# Patient Record
Sex: Male | Born: 1950 | Race: White | Hispanic: No | Smoking: Never smoker
Health system: Southern US, Community
[De-identification: ages and names within clinical notes are randomized; demographics above are authoritative.]

## PROBLEM LIST (undated history)

## (undated) DIAGNOSIS — C61 Malignant neoplasm of prostate: Secondary | ICD-10-CM

## (undated) DIAGNOSIS — I10 Essential (primary) hypertension: Secondary | ICD-10-CM

## (undated) DIAGNOSIS — C801 Malignant (primary) neoplasm, unspecified: Secondary | ICD-10-CM

## (undated) DIAGNOSIS — M109 Gout, unspecified: Secondary | ICD-10-CM

## (undated) HISTORY — DX: Essential (primary) hypertension: I10

## (undated) HISTORY — DX: Gout, unspecified: M10.9

---

## 1975-07-26 HISTORY — PX: RETINAL DETACHMENT SURGERY: SHX105

## 2003-07-26 HISTORY — PX: FINGER SURGERY: SHX640

## 2012-02-15 ENCOUNTER — Encounter: Payer: Self-pay | Admitting: Internal Medicine

## 2012-03-15 ENCOUNTER — Ambulatory Visit (AMBULATORY_SURGERY_CENTER): Payer: 59 | Admitting: *Deleted

## 2012-03-15 VITALS — Ht 73.0 in | Wt 171.7 lb

## 2012-03-15 DIAGNOSIS — Z1211 Encounter for screening for malignant neoplasm of colon: Secondary | ICD-10-CM

## 2012-03-15 MED ORDER — NA SULFATE-K SULFATE-MG SULF 17.5-3.13-1.6 GM/177ML PO SOLN: ORAL | Status: DC

## 2012-03-29 ENCOUNTER — Ambulatory Visit (AMBULATORY_SURGERY_CENTER): Payer: 59 | Admitting: Internal Medicine

## 2012-03-29 ENCOUNTER — Encounter: Payer: Self-pay | Admitting: Internal Medicine

## 2012-03-29 VITALS — BP 118/86 | HR 67 | Temp 97.0°F | Resp 18 | Ht 73.0 in | Wt 171.0 lb

## 2012-03-29 DIAGNOSIS — Z1211 Encounter for screening for malignant neoplasm of colon: Secondary | ICD-10-CM

## 2012-03-29 MED ORDER — SODIUM CHLORIDE 0.9 % IV SOLN: 500.0000 mL | INTRAVENOUS | Status: DC

## 2012-03-29 NOTE — Op Note (Signed)
[facility] [address] [place], [address]   COLONOSCOPY PROCEDURE REPORT  PATIENT: Nathan Schmitt, Nathan Schmitt  MR#: [id number] BIRTHDATE: January 10, 1951 , 61  yrs. old GENDER: Male ENDOSCOPIST: Beverley Fiedler, MD REFERRED BY:Orr, Richard PROCEDURE DATE:  03/29/2012 PROCEDURE:   Colonoscopy, screening ASA CLASS:   Class II INDICATIONS:average risk screening and first colonoscopy. MEDICATIONS: MAC sedation, administered by CRNA and Propofol (Diprivan) 230 mg IV  DESCRIPTION OF PROCEDURE:   After the risks benefits and alternatives of the procedure were thoroughly explained, informed consent was obtained.  A digital rectal exam revealed no rectal mass.   The LB PCF-H180AL [id number]  endoscope was introduced through the anus and advanced to the cecum, which was identified by both the appendix and ileocecal valve. No adverse events experienced. The quality of the prep was Suprep good  The instrument was then slowly withdrawn as the colon was fully examined.   COLON FINDINGS: Moderate diverticulosis was noted at the cecum, in the ascending colon, descending colon, and sigmoid colon.   The colon was otherwise normal.  There was no diverticulosis, inflamation, polyps or cancers unless previously stated. Retroflexed views revealed no abnormalities. The time to cecum=3 minutes 07 seconds.  Withdrawal time=11 minutes 30 seconds.  The scope was withdrawn and the procedure completed.  COMPLICATIONS: There were no complications.  ENDOSCOPIC IMPRESSION: 1.   Moderate diverticulosis was noted at the cecum, in the ascending colon, descending colon, and sigmoid colon 2.   The colon was otherwise normal  RECOMMENDATIONS: 1.  High fiber diet 2.  You should continue to follow colorectal cancer screening guidelines for "routine risk" patients with a repeat colonoscopy in 10 years.  There is no need for FOBT (stool) testing for at least 5 years.   eSigned:  Beverley Fiedler, MD 03/29/2012  9:58 AM   cc: Royanne Foots MD and The Patient

## 2012-03-29 NOTE — Patient Instructions (Addendum)

## 2012-03-29 NOTE — Progress Notes (Signed)
Patient did not experience any of the following events: a burn prior to discharge; a fall within the facility; wrong site/side/patient/procedure/implant event; or a hospital transfer or hospital admission upon discharge from the facility. (G8907) Patient did not have preoperative order for IV antibiotic SSI prophylaxis. (G8918)  

## 2012-03-30 ENCOUNTER — Telehealth: Payer: Self-pay | Admitting: *Deleted

## 2012-03-30 NOTE — Telephone Encounter (Signed)
  Follow up Call-  Call back number 03/29/2012  Post procedure Call Back phone  # [phone]  Permission to leave phone message Yes     Patient questions:  Do you have a fever, pain , or abdominal swelling? no Pain Score  0 *  Have you tolerated food without any problems? yes  Have you been able to return to your normal activities? yes  Do you have any questions about your discharge instructions: Diet   no Medications  no Follow up visit  no  Do you have questions or concerns about your Care? no  Actions: * If pain score is 4 or above: No action needed, pain <4.

## 2013-01-01 NOTE — Progress Notes (Signed)
GU Location of Tumor / Histology: Prostate dx 12/16/11 If Prostate Cancer, Gleason Score is (3+3=6,& 3+4=7) and PSA is (5.83) 04/17/12 bx=gleason 3+3=6 PSA 02/10/12=5.98   Patient presented with signs/symptoms of:No signs or symptoms found during routine exam of elevated PSA. Biopsies of Prostate on 12/14/12 &  04/17/12 (if applicable) revealed:Adenocarcinoma Past/Anticipated interventions by urology, if ZOX:WRUEAVW to decide if he prefers surgery or radiation as either one would be beneficial.  Past/Anticipated interventions by medical oncology, if UJW:JXBJ Weight changes, if any:No  Bowel/Bladder complaints, if any: Nocturia x2, Nausea/Vomiting, if any: No Pain issues, if any:No   SAFETY ISSUES:  Prior radiation? No                                                                                                                                                                                                                                                                                                                                             Pacemaker/ICD? NO  Possible current pregnancy? No  Is the patient on methotrexate? NO  Current Complaints / other details: Married,  1 son, 1 daughter,2 step-daughters, family hx breast cancer Father deceased age 29,  Allergies: PCNS

## 2013-01-02 ENCOUNTER — Ambulatory Visit
Admission: RE | Admit: 2013-01-02 | Discharge: 2013-01-02 | Disposition: A | Discharge status: Home / Self Care | Payer: 59 | Source: Ambulatory Visit | Attending: Radiation Oncology | Admitting: Radiation Oncology

## 2013-01-02 ENCOUNTER — Encounter: Payer: Self-pay | Admitting: Radiation Oncology

## 2013-01-02 VITALS — BP 119/78 | HR 66 | Temp 98.0°F | Ht 73.0 in | Wt 174.3 lb

## 2013-01-02 DIAGNOSIS — I10 Essential (primary) hypertension: Secondary | ICD-10-CM | POA: Insufficient documentation

## 2013-01-02 DIAGNOSIS — C61 Malignant neoplasm of prostate: Secondary | ICD-10-CM | POA: Insufficient documentation

## 2013-01-02 DIAGNOSIS — M109 Gout, unspecified: Secondary | ICD-10-CM | POA: Insufficient documentation

## 2013-01-02 DIAGNOSIS — Z79899 Other long term (current) drug therapy: Secondary | ICD-10-CM | POA: Insufficient documentation

## 2013-01-02 NOTE — Progress Notes (Signed)
Please see the Nurse Progress Note in the MD Initial Consult Encounter for this patient. 

## 2013-01-02 NOTE — Progress Notes (Signed)
[facility] Radiation Oncology NEW PATIENT EVALUATION  Name: Nathan Schmitt MRN: [id number]  Date:   01/02/2013           DOB: 10/24/1950  Status: outpatient   CC: No primary provider on file.  Kathi Ludwig, *  Dr. Crecencio Mc   REFERRING PHYSICIAN: Jethro Bolus I, *   DIAGNOSIS: Stage TI C. intermediate risk adenocarcinoma prostate   HISTORY OF PRESENT ILLNESS:  Nathan Schmitt is a 62 y.o. male who is seen today for the courtesy Dr. Patsi Sears for discussion of possible radiation therapy in the management of his stage TI C. intermediate risk adenocarcinoma prostate. He had fluctuating PSAs in the 4 range up until a rise to 5.9 and 2013 when he was seen at [facility]. He was seen by Dr. Patsi Sears who performed ultrasound-guided biopsies on 04/17/2012 and he was found to have Gleason 6 (3+3) involving less than 5% of one core from the left apex. His PSA rose to 5.83 by 10/24/2012 and repeat biopsies on 12/14/2012 showed Gleason 7 (3+4) involving 20% of one core from the right apex,  Gleason 6 (3+3) involving 20% of each of 2 cores from the right lateral apex and 5% of each of 2 cores from the left lateral apex and 5% of one core from the left lateral mid gland. He is doing well from a GU and GI standpoint. His gland volume was 30.3 cc. He does have mild erectile dysfunction. He was seen in consultation by Dr. Laverle Patter for discussion of robotic prostatectomy. PREVIOUS RADIATION THERAPY: No   PAST MEDICAL HISTORY:  has a past medical history of Hypertension and Gout.     PAST SURGICAL HISTORY:  Past Surgical History  Procedure Laterality Date  . Retinal detachment surgery  1977    left  . Finger surgery  2005    removal cyst 5th finger left hand     FAMILY HISTORY: family history includes Colon cancer (age of onset: 57) in his maternal uncle and Prostate cancer (age of onset: 45) in his father.  There is no history of Stomach cancer and Rectal  cancer. His father died from metastatic prostate cancer a 70. He was diagnosed at age 33. His mother is alive, having had an aortic valve replacement. She is 84.   SOCIAL HISTORY:  reports that he has never smoked. He quit smokeless tobacco use about 6 years ago. His smokeless tobacco use included Chew. He reports that  drinks alcohol. He reports that he does not use illicit drugs. Married, 2 children. Retired from a Education officer, community, currently works part-time in [employer].   ALLERGIES: Penicillins   MEDICATIONS:  Current Outpatient Prescriptions  Medication Sig Dispense Refill  . allopurinol (ZYLOPRIM) 300 MG tablet Take 300 mg by mouth daily.      Marland Kitchen lisinopril (PRINIVIL,ZESTRIL) 10 MG tablet Take 10 mg by mouth daily.       No current facility-administered medications for this encounter.     REVIEW OF SYSTEMS:  Pertinent items are noted in HPI.    PHYSICAL EXAM:  height is 6\' 1"  (1.854 m) and weight is 174 lb 4.8 oz (79.062 kg). His temperature is 98 F (36.7 C). His blood pressure is 119/78 and his pulse is 66.   Alert, oriented, in well-developed 62 year old white male appearing slightly younger than his stated age. Head and neck examination: Grossly unremarkable. Nodes: Without palpable cervical or supraclavicular adenopathy. Chest: Lungs clear. Back: Without spinal or CVA tenderness. Heart:  Regular rate and rhythm. Abdomen: Without masses organomegaly. Genitalia: Unremarkable to inspection. Rectal: The prostate gland is normal size and is without focal induration or nodularity. Extremities: Without edema.   LABORATORY DATA:  No results found for this basename: WBC, HGB, HCT, MCV, PLT   No results found for this basename: NA, K, CL, CO2   No results found for this basename: ALT, AST, GGT, ALKPHOS, BILITOT   PSA 5.83 from 10/24/2012   IMPRESSION: Stage TI C. intermediate risk adenocarcinoma prostate. I explained to the patient that his prognosis is related to his  stage, PSA level, and Gleason score. His stage and PSA level are favorable while his Gleason score of 7 is of intermediate favorability. Other prognostic factors include PSA doubling time and disease volume which also appear to be favorable. His radiation therapy options include seed implantation alone or 8 weeks of external beam/IMRT. We discussed the potential acute and late toxicities of radiation therapy. We also talked about the need for a CT arch study should he want to consider seed implantation. Success with surgery, or radiation therapy is felt to be equivalent. His prognosis is favorable. He'll think things over and get back in touch with Dr. Patsi Sears. He was given literature for review.   PLAN: As discussed above  I spent 60 minutes minutes face to face with the patient and more than 50% of that time was spent in counseling and/or coordination of care.

## 2013-01-03 NOTE — Addendum Note (Signed)
Encounter addended by: Tessa Lerner, RN on: 01/03/2013  6:00 PM<BR>     Documentation filed: Charges VN

## 2013-02-01 ENCOUNTER — Other Ambulatory Visit: Payer: Self-pay | Admitting: Urology

## 2013-02-06 ENCOUNTER — Encounter (HOSPITAL_COMMUNITY): Payer: Self-pay | Admitting: Pharmacy Technician

## 2013-02-07 ENCOUNTER — Other Ambulatory Visit (HOSPITAL_COMMUNITY): Payer: Self-pay | Admitting: Urology

## 2013-02-07 NOTE — Patient Instructions (Addendum)
20 EDYN QAZI  02/07/2013   Your procedure is scheduled on:  02/14/13  THURSDAY  Report to [facility] at   0515    AM.  Call this number if you have problems the morning of surgery: [phone]       Remember: BOWEL PREP AS PER OFFICE  Do not TAKE ANYTHING BY MOUTH  After Midnight. Wednesday NIGHT   Take these medicines the morning of surgery with A SIP OF WATER:  NONE   .  Contacts, dentures or partial plates can not be worn to surgery  Leave suitcase in the car. After surgery it may be brought to your room.  For patients admitted to the hospital, checkout time is 11:00 AM day of  discharge.             SPECIAL INSTRUCTIONS- SEE [place] PREPARING FOR SURGERY INSTRUCTION SHEET-     DO NOT WEAR JEWELRY, LOTIONS, POWDERS, OR PERFUMES.  WOMEN-- DO NOT SHAVE LEGS OR UNDERARMS FOR 12 HOURS BEFORE SHOWERS. MEN MAY SHAVE FACE.  Patients discharged the day of surgery will not be allowed to drive home. IF going home the day of surgery, you must have a driver and someone to stay with you for the first 24 hours  Name and phone number of your driver:   OVERNIGHT STAY     WIFE                                                                 Please read over the following fact sheets that you were given:  Incentive Spirometry Sheet, Blood Transfusion Sheet  Information                                                                                   Oral [place]gren  PST 336  [phone]                 FAILURE TO FOLLOW THESE INSTRUCTIONS MAY RESULT IN  CANCELLATION   OF YOUR SURGERY                                                  Patient Signature _____________________________

## 2013-02-08 ENCOUNTER — Ambulatory Visit (HOSPITAL_COMMUNITY)
Admission: RE | Admit: 2013-02-08 | Discharge: 2013-02-08 | Disposition: A | Discharge status: Home / Self Care | Payer: 59 | Source: Ambulatory Visit | Attending: Urology | Admitting: Urology

## 2013-02-08 ENCOUNTER — Encounter (HOSPITAL_COMMUNITY): Payer: Self-pay

## 2013-02-08 ENCOUNTER — Encounter (HOSPITAL_COMMUNITY)
Admission: RE | Admit: 2013-02-08 | Discharge: 2013-02-08 | Disposition: A | Discharge status: Home / Self Care | Payer: 59 | Source: Ambulatory Visit | Attending: Urology | Admitting: Urology

## 2013-02-08 DIAGNOSIS — Z01810 Encounter for preprocedural cardiovascular examination: Secondary | ICD-10-CM | POA: Insufficient documentation

## 2013-02-08 DIAGNOSIS — C61 Malignant neoplasm of prostate: Secondary | ICD-10-CM | POA: Insufficient documentation

## 2013-02-08 DIAGNOSIS — Z01812 Encounter for preprocedural laboratory examination: Secondary | ICD-10-CM | POA: Insufficient documentation

## 2013-02-08 DIAGNOSIS — Z01818 Encounter for other preprocedural examination: Secondary | ICD-10-CM | POA: Insufficient documentation

## 2013-02-08 HISTORY — DX: Malignant (primary) neoplasm, unspecified: C80.1

## 2013-02-08 LAB — BASIC METABOLIC PANEL
BUN: 17 mg/dL (ref 6–23)
GFR calc Af Amer: 69 mL/min — ABNORMAL LOW (ref >90)
GFR calc non Af Amer: 60 mL/min — ABNORMAL LOW (ref >90)
Potassium: 4.3 mEq/L (ref 3.5–5.1)
Sodium: 139 mEq/L (ref 135–145)

## 2013-02-08 LAB — CBC
Hemoglobin: 12.3 g/dL — ABNORMAL LOW (ref 13.0–17.0)
MCHC: 32 g/dL (ref 30.0–36.0)

## 2013-02-13 NOTE — H&P (Signed)
Chief Complaint  Prostate Cancer     History of Present Illness  [name] is a 6 healthy man who was noted to have an elevated PSA of 5.98 prompting an intial urologic consultation by Dr. Patsi Sears last year.  His PSA was repeated and remained elevated at 4.91 which resulted in a prostate biopsy on 04/17/12.  This biopsy demonstrated < 5 % of 1 out of 12 biopsy cores positive for Gleason 3+3=6 adenocarcinoma. After discussing options, he initially elected to proceed with active surveillance. His PSA increased to 5.83 in April 2014 and he underwent a repeat biopsy on 12/14/12 which demonstrated 6 out of 20 cores to be positive with one core now indicating Gleason 3+4=7 adenocarcinoma. He does have a paternal family history of prostate cancer.  His father apparently underwent radiation therapy but was quickly determined to have metastatic disease and died of metastatic prostate cancer at age 55.  TNM stage: cT1c Nx Mx PSA: 5.83 Gleason score: 3+4=7 Initial diagnosis: September 2013 Biopsy (12/14/12): 6/20 cores positive    Left: L lateral apex (2/2 cores, 5% - 3+3=6, 5% - 3+3=6), L lateral mid (5%, 3+3=6)    Right: R apex (20%, 3+4=7), R lateral apex (2/2 cores, 20% - 3+3=6, 20%, 3+3=6) Prostate volume: 30.3 cc  Nomogram OC disease: 76% EPE: 16% SVI: 3% LNI: 2.4% PFS (surgery): 95% at 5 years, 92% at 10 years DSS: 99% at 5 and 10 years  Urinary function: He has minimal lower urinary tract symptoms.  IPSS is 2. Erectile function: He very mild erectile dysfunction.  He has not required medical therapy.  SHIM score is 22.   Past Medical History Problems  1. History of  Gout 274.9 2. History of  Hypertension 401.9  Surgical History Problems  1. History of  Hand Surgery Left 2. History of  Repair Of Retinal Detachment  Current Meds 1. Allopurinol 300 MG Oral Tablet; Therapy: (Recorded:27Aug2013) to 2. Lisinopril 10 MG Oral Tablet; Therapy: (Recorded:27Aug2013)  to  Allergies Medication  1. Penicillins   Penicillin causes hives.   Family History Problems  1. Maternal history of  Breast Cancer V16.3 2. Sororal history of  Breast Cancer V16.3 3. Family history of  Father Deceased At Age 12 prostate cancer 4. Paternal history of  Prostate Cancer V16.42  Social History Problems    Alcohol Use occasionally   Marital History - Currently Married   Never A Smoker   Occupation: Retired   Tobacco Use 305.1  Review of Systems Constitutional, skin, eye, otolaryngeal, hematologic/lymphatic, cardiovascular, pulmonary, endocrine, musculoskeletal, gastrointestinal, neurological and psychiatric system(s) were reviewed and pertinent findings if present are noted.    Vitals  BMI Calculated: 23.19 BSA Calculated: 2.03 Height: 6 ft 1 in Weight: 175 lb    Physical Exam Constitutional: Well nourished and well developed . No acute distress.  ENT:. The ears and nose are normal in appearance.  Neck: The appearance of the neck is normal and no neck mass is present.  Pulmonary: No respiratory distress, normal respiratory rhythm and effort and clear bilateral breath sounds.  Cardiovascular: Heart rate and rhythm are normal . No peripheral edema.  Abdomen: The abdomen is flat. The abdomen is soft and nontender. No masses are palpated. No CVA tenderness. No hernias are palpable. No hepatosplenomegaly noted.       Assessment  Prostate Cancer 185        Discussion/Summary  1.  Clinically localized prostate cancer: He has elected to proceed with surgical therapy and will  undergo a bilateral nerve sparing robotic-assisted laparoscopic radical prostatectomy and pelvic lymphadenectomy.

## 2013-02-14 ENCOUNTER — Ambulatory Visit (HOSPITAL_COMMUNITY): Payer: 59 | Admitting: Anesthesiology

## 2013-02-14 ENCOUNTER — Encounter (HOSPITAL_COMMUNITY): Payer: Self-pay | Admitting: *Deleted

## 2013-02-14 ENCOUNTER — Observation Stay (HOSPITAL_COMMUNITY)
Admission: RE | Admit: 2013-02-14 | Discharge: 2013-02-15 | Disposition: A | Discharge status: Home / Self Care | Payer: 59 | Source: Ambulatory Visit | Attending: Urology | Admitting: Urology

## 2013-02-14 ENCOUNTER — Encounter (HOSPITAL_COMMUNITY)
Admission: RE | Disposition: A | Discharge status: Home / Self Care | Payer: Self-pay | Source: Ambulatory Visit | Attending: Urology

## 2013-02-14 ENCOUNTER — Encounter (HOSPITAL_COMMUNITY): Payer: Self-pay | Admitting: Anesthesiology

## 2013-02-14 DIAGNOSIS — Z803 Family history of malignant neoplasm of breast: Secondary | ICD-10-CM | POA: Insufficient documentation

## 2013-02-14 DIAGNOSIS — R110 Nausea: Secondary | ICD-10-CM | POA: Insufficient documentation

## 2013-02-14 DIAGNOSIS — C61 Malignant neoplasm of prostate: Principal | ICD-10-CM | POA: Insufficient documentation

## 2013-02-14 DIAGNOSIS — Z8042 Family history of malignant neoplasm of prostate: Secondary | ICD-10-CM | POA: Insufficient documentation

## 2013-02-14 DIAGNOSIS — Z79899 Other long term (current) drug therapy: Secondary | ICD-10-CM | POA: Insufficient documentation

## 2013-02-14 DIAGNOSIS — I10 Essential (primary) hypertension: Secondary | ICD-10-CM | POA: Insufficient documentation

## 2013-02-14 HISTORY — PX: ROBOT ASSISTED LAPAROSCOPIC RADICAL PROSTATECTOMY: SHX5141

## 2013-02-14 LAB — TYPE AND SCREEN
ABO/RH(D): A NEG
Antibody Screen: NEGATIVE

## 2013-02-14 LAB — HEMOGLOBIN AND HEMATOCRIT, BLOOD
HCT: 38.3 % — ABNORMAL LOW (ref 39.0–52.0)
Hemoglobin: 12.1 g/dL — ABNORMAL LOW (ref 13.0–17.0)

## 2013-02-14 SURGERY — ROBOTIC ASSISTED LAPAROSCOPIC RADICAL PROSTATECTOMY LEVEL 1
Anesthesia: General | Wound class: Clean Contaminated

## 2013-02-14 MED ORDER — LIDOCAINE HCL (CARDIAC) 20 MG/ML IV SOLN
INTRAVENOUS | Status: DC | PRN
  Administered 2013-02-14: 30 mg via INTRAVENOUS

## 2013-02-14 MED ORDER — ACETAMINOPHEN 325 MG PO TABS: 650.0000 mg | ORAL_TABLET | ORAL | Status: DC | PRN

## 2013-02-14 MED ORDER — SODIUM CHLORIDE 0.9 % IR SOLN
Status: DC | PRN
  Administered 2013-02-14: 300 mL via INTRAVESICAL

## 2013-02-14 MED ORDER — LACTATED RINGERS IV SOLN
INTRAVENOUS | Status: DC | PRN
  Administered 2013-02-14: 08:00:00

## 2013-02-14 MED ORDER — KETOROLAC TROMETHAMINE 15 MG/ML IJ SOLN
15.0000 mg | Freq: Four times a day (QID) | INTRAMUSCULAR | Status: DC
  Administered 2013-02-14 – 2013-02-15 (×4): 15 mg via INTRAVENOUS
  Filled 2013-02-14 (×5): qty 1

## 2013-02-14 MED ORDER — LACTATED RINGERS IV SOLN
INTRAVENOUS | Status: DC | PRN
  Administered 2013-02-14 (×2): via INTRAVENOUS

## 2013-02-14 MED ORDER — CEFAZOLIN SODIUM 1-5 GM-% IV SOLN
1.0000 g | Freq: Three times a day (TID) | INTRAVENOUS | Status: AC
  Administered 2013-02-14 (×2): 1 g via INTRAVENOUS
  Filled 2013-02-14 (×2): qty 50

## 2013-02-14 MED ORDER — EPHEDRINE SULFATE 50 MG/ML IJ SOLN
INTRAMUSCULAR | Status: DC | PRN
  Administered 2013-02-14 (×3): 10 mg via INTRAVENOUS

## 2013-02-14 MED ORDER — DIPHENHYDRAMINE HCL 12.5 MG/5ML PO ELIX: 12.5000 mg | ORAL_SOLUTION | Freq: Four times a day (QID) | ORAL | Status: DC | PRN

## 2013-02-14 MED ORDER — SUCCINYLCHOLINE CHLORIDE 20 MG/ML IJ SOLN
INTRAMUSCULAR | Status: DC | PRN
  Administered 2013-02-14: 100 mg via INTRAVENOUS

## 2013-02-14 MED ORDER — BUPIVACAINE-EPINEPHRINE 0.25% -1:200000 IJ SOLN
INTRAMUSCULAR | Status: DC | PRN
  Administered 2013-02-14: 30 mL

## 2013-02-14 MED ORDER — DOCUSATE SODIUM 100 MG PO CAPS
100.0000 mg | ORAL_CAPSULE | Freq: Two times a day (BID) | ORAL | Status: DC
  Administered 2013-02-14 – 2013-02-15 (×2): 100 mg via ORAL
  Filled 2013-02-14 (×3): qty 1

## 2013-02-14 MED ORDER — HYDROCODONE-ACETAMINOPHEN 5-325 MG PO TABS: 1.0000 | ORAL_TABLET | Freq: Four times a day (QID) | ORAL | Status: DC | PRN

## 2013-02-14 MED ORDER — CISATRACURIUM BESYLATE (PF) 10 MG/5ML IV SOLN
INTRAVENOUS | Status: DC | PRN
  Administered 2013-02-14: 1 mg via INTRAVENOUS
  Administered 2013-02-14: 10 mg via INTRAVENOUS
  Administered 2013-02-14: 6 mg via INTRAVENOUS

## 2013-02-14 MED ORDER — CIPROFLOXACIN HCL 500 MG PO TABS: 500.0000 mg | ORAL_TABLET | Freq: Two times a day (BID) | ORAL | Status: DC

## 2013-02-14 MED ORDER — FENTANYL CITRATE 0.05 MG/ML IJ SOLN
INTRAMUSCULAR | Status: DC | PRN
  Administered 2013-02-14: 100 ug via INTRAVENOUS
  Administered 2013-02-14: 25 ug via INTRAVENOUS
  Administered 2013-02-14: 100 ug via INTRAVENOUS
  Administered 2013-02-14: 25 ug via INTRAVENOUS

## 2013-02-14 MED ORDER — MORPHINE SULFATE 2 MG/ML IJ SOLN: 2.0000 mg | INTRAMUSCULAR | Status: DC | PRN

## 2013-02-14 MED ORDER — KCL IN DEXTROSE-NACL 20-5-0.45 MEQ/L-%-% IV SOLN
INTRAVENOUS | Status: DC
  Administered 2013-02-14 – 2013-02-15 (×3): via INTRAVENOUS
  Filled 2013-02-14 (×5): qty 1000

## 2013-02-14 MED ORDER — MIDAZOLAM HCL 5 MG/5ML IJ SOLN
INTRAMUSCULAR | Status: DC | PRN
  Administered 2013-02-14: 1 mg via INTRAVENOUS

## 2013-02-14 MED ORDER — SODIUM CHLORIDE 0.9 % IV BOLUS (SEPSIS)
1000.0000 mL | Freq: Once | INTRAVENOUS | Status: AC
  Administered 2013-02-14: 1000 mL via INTRAVENOUS

## 2013-02-14 MED ORDER — LACTATED RINGERS IV SOLN: INTRAVENOUS | Status: DC

## 2013-02-14 MED ORDER — HYDROMORPHONE HCL PF 1 MG/ML IJ SOLN
INTRAMUSCULAR | Status: DC | PRN
  Administered 2013-02-14 (×4): 0.5 mg via INTRAVENOUS

## 2013-02-14 MED ORDER — ONDANSETRON HCL 4 MG/2ML IJ SOLN
INTRAMUSCULAR | Status: DC | PRN
  Administered 2013-02-14 (×2): 2 mg via INTRAVENOUS

## 2013-02-14 MED ORDER — DIPHENHYDRAMINE HCL 50 MG/ML IJ SOLN: 12.5000 mg | Freq: Four times a day (QID) | INTRAMUSCULAR | Status: DC | PRN

## 2013-02-14 MED ORDER — PROPOFOL 10 MG/ML IV BOLUS
INTRAVENOUS | Status: DC | PRN
  Administered 2013-02-14: 200 mg via INTRAVENOUS

## 2013-02-14 MED ORDER — DEXAMETHASONE SODIUM PHOSPHATE 4 MG/ML IJ SOLN
INTRAMUSCULAR | Status: DC | PRN
  Administered 2013-02-14: 10 mg via INTRAVENOUS

## 2013-02-14 MED ORDER — CEFAZOLIN SODIUM-DEXTROSE 2-3 GM-% IV SOLR
2.0000 g | INTRAVENOUS | Status: AC
  Administered 2013-02-14: 2 g via INTRAVENOUS

## 2013-02-14 MED ORDER — KETAMINE HCL 10 MG/ML IJ SOLN
INTRAMUSCULAR | Status: DC | PRN
  Administered 2013-02-14: 20 mg via INTRAVENOUS

## 2013-02-14 MED ORDER — NEOSTIGMINE METHYLSULFATE 1 MG/ML IJ SOLN
INTRAMUSCULAR | Status: DC | PRN
  Administered 2013-02-14: 3 mg via INTRAVENOUS

## 2013-02-14 MED ORDER — ALLOPURINOL 300 MG PO TABS
300.0000 mg | ORAL_TABLET | Freq: Every day | ORAL | Status: DC
  Administered 2013-02-14: 300 mg via ORAL
  Filled 2013-02-14 (×2): qty 1

## 2013-02-14 MED ORDER — PROMETHAZINE HCL 25 MG/ML IJ SOLN: 6.2500 mg | INTRAMUSCULAR | Status: DC | PRN

## 2013-02-14 MED ORDER — INDIGOTINDISULFONATE SODIUM 8 MG/ML IJ SOLN
INTRAMUSCULAR | Status: DC | PRN
  Administered 2013-02-14 (×2): 5 mL via INTRAVENOUS

## 2013-02-14 MED ORDER — GLYCOPYRROLATE 0.2 MG/ML IJ SOLN
INTRAMUSCULAR | Status: DC | PRN
  Administered 2013-02-14: 0.3 mg via INTRAVENOUS
  Administered 2013-02-14: 0.1 mg via INTRAVENOUS
  Administered 2013-02-14: 0.4 mg via INTRAVENOUS

## 2013-02-14 MED ORDER — ONDANSETRON HCL 4 MG/2ML IJ SOLN
4.0000 mg | INTRAMUSCULAR | Status: DC | PRN
  Administered 2013-02-14: 4 mg via INTRAVENOUS
  Filled 2013-02-14: qty 2

## 2013-02-14 MED ORDER — HYDROMORPHONE HCL PF 1 MG/ML IJ SOLN
0.2500 mg | INTRAMUSCULAR | Status: DC | PRN
  Administered 2013-02-14 (×2): 0.5 mg via INTRAVENOUS

## 2013-02-14 SURGICAL SUPPLY — 42 items
CANISTER SUCTION 2500CC (MISCELLANEOUS) ×2 IMPLANT
CATH FOLEY 2WAY SLVR 18FR 30CC (CATHETERS) ×2 IMPLANT
CATH ROBINSON RED A/P 16FR (CATHETERS) ×2 IMPLANT
CATH ROBINSON RED A/P 8FR (CATHETERS) ×2 IMPLANT
CATH TIEMANN FOLEY 18FR 5CC (CATHETERS) ×2 IMPLANT
CHLORAPREP W/TINT 26ML (MISCELLANEOUS) ×2 IMPLANT
CLIP LIGATING HEM O LOK PURPLE (MISCELLANEOUS) ×4 IMPLANT
CLOTH BEACON ORANGE TIMEOUT ST (SAFETY) ×2 IMPLANT
CORD HIGH FREQUENCY UNIPOLAR (ELECTROSURGICAL) ×2 IMPLANT
COVER SURGICAL LIGHT HANDLE (MISCELLANEOUS) ×2 IMPLANT
COVER TIP SHEARS 8 DVNC (MISCELLANEOUS) ×1 IMPLANT
COVER TIP SHEARS 8MM DA VINCI (MISCELLANEOUS) ×1
CUTTER ECHEON FLEX ENDO 45 340 (ENDOMECHANICALS) ×2 IMPLANT
DECANTER SPIKE VIAL GLASS SM (MISCELLANEOUS) ×2 IMPLANT
DRAPE SURG IRRIG POUCH 19X23 (DRAPES) ×2 IMPLANT
DRSG TEGADERM 2-3/8X2-3/4 SM (GAUZE/BANDAGES/DRESSINGS) ×8 IMPLANT
DRSG TEGADERM 4X4.75 (GAUZE/BANDAGES/DRESSINGS) ×4 IMPLANT
DRSG TEGADERM 6X8 (GAUZE/BANDAGES/DRESSINGS) ×4 IMPLANT
ELECT REM PT RETURN 9FT ADLT (ELECTROSURGICAL) ×2
ELECTRODE REM PT RTRN 9FT ADLT (ELECTROSURGICAL) ×1 IMPLANT
GAUZE SPONGE 2X2 8PLY STRL LF (GAUZE/BANDAGES/DRESSINGS) ×1 IMPLANT
GLOVE BIO SURGEON STRL SZ 6.5 (GLOVE) ×2 IMPLANT
GLOVE BIOGEL M STRL SZ7.5 (GLOVE) ×4 IMPLANT
GOWN STRL NON-REIN LRG LVL3 (GOWN DISPOSABLE) ×6 IMPLANT
GOWN STRL REIN XL XLG (GOWN DISPOSABLE) ×4 IMPLANT
HOLDER FOLEY CATH W/STRAP (MISCELLANEOUS) ×2 IMPLANT
IV LACTATED RINGERS 1000ML (IV SOLUTION) ×2 IMPLANT
KIT ACCESSORY DA VINCI DISP (KITS) ×1
KIT ACCESSORY DVNC DISP (KITS) ×1 IMPLANT
NDL SAFETY ECLIPSE 18X1.5 (NEEDLE) ×1 IMPLANT
NEEDLE HYPO 18GX1.5 SHARP (NEEDLE) ×1
PACK ROBOT UROLOGY CUSTOM (CUSTOM PROCEDURE TRAY) ×2 IMPLANT
RELOAD GREEN ECHELON 45 (STAPLE) ×2 IMPLANT
SCISSORS LAP 5X35 DISP (ENDOMECHANICALS) ×2 IMPLANT
SET TUBE IRRIG SUCTION NO TIP (IRRIGATION / IRRIGATOR) ×2 IMPLANT
SOLUTION ELECTROLUBE (MISCELLANEOUS) ×2 IMPLANT
SPONGE GAUZE 2X2 STER 10/PKG (GAUZE/BANDAGES/DRESSINGS) ×1
SUT ETHILON 3 0 PS 1 (SUTURE) ×2 IMPLANT
SUT VICRYL 0 UR6 27IN ABS (SUTURE) ×4 IMPLANT
SYR 27GX1/2 1ML LL SAFETY (SYRINGE) ×2 IMPLANT
TOWEL OR NON WOVEN STRL DISP B (DISPOSABLE) ×2 IMPLANT
WATER STERILE IRR 1500ML POUR (IV SOLUTION) ×4 IMPLANT

## 2013-02-14 NOTE — Anesthesia Preprocedure Evaluation (Signed)
Anesthesia Evaluation  Patient identified by MRN, date of birth, ID band Patient awake    Reviewed: Allergy & Precautions, H&P , NPO status , Patient's Chart, lab work & pertinent test results  Airway Mallampati: II TM Distance: >3 FB Neck ROM: Full    Dental  (+) Teeth Intact and Dental Advisory Given,    Pulmonary neg pulmonary ROS,  breath sounds clear to auscultation  Pulmonary exam normal       Cardiovascular hypertension, Pt. on medications Rhythm:Regular Rate:Normal     Neuro/Psych negative neurological ROS  negative psych ROS   GI/Hepatic negative GI ROS, Neg liver ROS,   Endo/Other  negative endocrine ROS  Renal/GU negative Renal ROS  negative genitourinary   Musculoskeletal negative musculoskeletal ROS (+)   Abdominal   Peds  Hematology negative hematology ROS (+)   Anesthesia Other Findings   Reproductive/Obstetrics                           Anesthesia Physical Anesthesia Plan  ASA: II  Anesthesia Plan: General   Post-op Pain Management:    Induction: Intravenous  Airway Management Planned: Oral ETT  Additional Equipment:   Intra-op Plan:   Post-operative Plan: Extubation in OR  Informed Consent: I have reviewed the patients History and Physical, chart, labs and discussed the procedure including the risks, benefits and alternatives for the proposed anesthesia with the patient or authorized representative who has indicated his/her understanding and acceptance.   Dental advisory given  Plan Discussed with: CRNA  Anesthesia Plan Comments:         Anesthesia Quick Evaluation

## 2013-02-14 NOTE — Transfer of Care (Signed)
Immediate Anesthesia Transfer of Care Note  Patient: Nathan Schmitt  Procedure(s) Performed: Procedure(s): ROBOTIC ASSISTED LAPAROSCOPIC RADICAL PROSTATECTOMY  WITH BILATERAL NODE DISSECTIONS (N/A)  Patient Location: PACU  Anesthesia Type:General  Level of Consciousness: awake, alert , oriented, sedated and patient cooperative  Airway & Oxygen Therapy: Patient Spontanous Breathing and Patient connected to nasal cannula oxygen  Post-op Assessment: Report given to PACU RN and Post -op Vital signs reviewed and stable  Post vital signs: stable  Complications: No apparent anesthesia complications

## 2013-02-14 NOTE — Progress Notes (Signed)
Patient ID: Nathan Schmitt, male   DOB: [birth year], 62 y.o.   MRN: [id number] Post-op note  Subjective: The patient is doing well.  No complaints except some mild nausea.  Objective: Vital signs in last 24 hours: Temp:  [97.3 F (36.3 C)-97.6 F (36.4 C)] 97.6 F (36.4 C) (07/24 1115) Pulse Rate:  [48-64] 48 (07/24 1115) Resp:  [7-16] 12 (07/24 1115) BP: (109-129)/(55-72) 117/57 mmHg (07/24 1115) SpO2:  [97 %-100 %] 100 % (07/24 1115) Weight:  [78.019 kg (172 lb)] 78.019 kg (172 lb) (07/24 1115)  Intake/Output from previous day:   Intake/Output this shift: Total I/O In: 3000 [I.V.:2000; IV Piggyback:1000] Out: 80 [Urine:65; Blood:15]  Physical Exam:  General: Alert and oriented. Abdomen: Soft, Nondistended. Incisions: Clean and dry.  Lab Results:  Recent Labs  02/14/13 1035  HGB 12.1*  HCT 38.3*    Assessment/Plan: POD#0   1) Continue to monitor 2) Amb, IS, DVT prophy, pain control, clears     LOS: 0 days   YARBROUGH,Remmi Armenteros G. 02/14/2013, 2:17 PM

## 2013-02-14 NOTE — Op Note (Signed)

## 2013-02-14 NOTE — Interval H&P Note (Signed)
History and Physical Interval Note:  02/14/2013 7:37 AM  Nathan Schmitt  has presented today for surgery, with the diagnosis of PROSTATE CANCER  The various methods of treatment have been discussed with the patient and family. After consideration of risks, benefits and other options for treatment, the patient has consented to  Procedure(s): ROBOTIC ASSISTED LAPAROSCOPIC RADICAL PROSTATECTOMY LEVEL 1 (N/A) as a surgical intervention .  The patient's history has been reviewed, patient examined, no change in status, stable for surgery.  I have reviewed the patient's chart and labs.  Questions were answered to the patient's satisfaction.     Jilene Spohr,LES

## 2013-02-14 NOTE — Anesthesia Postprocedure Evaluation (Signed)
Anesthesia Post Note  Patient: Nathan Schmitt  Procedure(s) Performed: Procedure(s) (LRB): ROBOTIC ASSISTED LAPAROSCOPIC RADICAL PROSTATECTOMY  WITH BILATERAL NODE DISSECTIONS (N/A)  Anesthesia type: General  Patient location: PACU  Post pain: Pain level controlled  Post assessment: Post-op Vital signs reviewed  Last Vitals:  Filed Vitals:   02/14/13 1115  BP: 117/57  Pulse: 48  Temp: 36.4 C  Resp: 12    Post vital signs: Reviewed  Level of consciousness: sedated  Complications: No apparent anesthesia complications

## 2013-02-15 ENCOUNTER — Encounter (HOSPITAL_COMMUNITY): Payer: Self-pay | Admitting: Urology

## 2013-02-15 MED ORDER — BISACODYL 10 MG RE SUPP
10.0000 mg | Freq: Once | RECTAL | Status: AC
  Administered 2013-02-15: 10 mg via RECTAL
  Filled 2013-02-15: qty 1

## 2013-02-15 MED ORDER — HYDROCODONE-ACETAMINOPHEN 5-325 MG PO TABS: 1.0000 | ORAL_TABLET | Freq: Four times a day (QID) | ORAL | Status: DC | PRN

## 2013-02-15 NOTE — Discharge Summary (Signed)
  Date of admission: 02/14/2013  Date of discharge: 02/15/2013  Admission diagnosis: Prostate Cancer  Discharge diagnosis: Prostate Cancer  History and Physical: For full details, please see admission history and physical. Briefly, Nathan Schmitt is a 62 y.o. gentleman with localized prostate cancer.  After discussing management/treatment options, he elected to proceed with surgical treatment.  Hospital Course: SACHIT GILMAN was taken to the operating room on 02/14/2013 and underwent a robotic assisted laparoscopic radical prostatectomy. He tolerated this procedure well and without complications. Postoperatively, he was able to be transferred to a regular hospital room following recovery from anesthesia.  He was able to begin ambulating the night of surgery. He remained hemodynamically stable overnight.  He had excellent urine output with appropriately minimal output from his pelvic drain and his pelvic drain was removed on POD #1.  He was transitioned to oral pain medication, tolerated a clear liquid diet, and had met all discharge criteria and was able to be discharged home later on POD#1.  Laboratory values:  Recent Labs  02/14/13 1035 02/15/13 0440  HGB 12.1* 10.9*  HCT 38.3* 34.8*    Disposition: Home  Discharge instruction: He was instructed to be ambulatory but to refrain from heavy lifting, strenuous activity, or driving. He was instructed on urethral catheter care.  Discharge medications:     Medication List    STOP taking these medications       ibuprofen 200 MG tablet  Commonly known as:  ADVIL,MOTRIN     multivitamin with minerals Tabs      TAKE these medications       allopurinol 300 MG tablet  Commonly known as:  ZYLOPRIM  Take 300 mg by mouth daily.     ciprofloxacin 500 MG tablet  Commonly known as:  CIPRO  Take 1 tablet (500 mg total) by mouth 2 (two) times daily. Start day prior to office visit for foley removal     HYDROcodone-acetaminophen  5-325 MG per tablet  Commonly known as:  NORCO  Take 1-2 tablets by mouth every 6 (six) hours as needed for pain.     lisinopril 10 MG tablet  Commonly known as:  PRINIVIL,ZESTRIL  Take 10 mg by mouth every evening.        Followup: He will followup in 1 week for catheter removal and to discuss his surgical pathology results.

## 2013-02-15 NOTE — Progress Notes (Signed)
Patient ID: Nathan Schmitt, male   DOB: 26-Sep-1950, 62 y.o.   MRN: [id number]  1 Day Post-Op Subjective: The patient is doing well.  No nausea or vomiting. Pain is adequately controlled.  Objective: Vital signs in last 24 hours: Temp:  [97.5 F (36.4 C)-98.4 F (36.9 C)] 97.8 F (36.6 C) (07/25 0530) Pulse Rate:  [48-66] 66 (07/25 0530) Resp:  [7-16] 16 (07/25 0530) BP: (100-129)/(53-72) 104/63 mmHg (07/25 0530) SpO2:  [97 %-100 %] 99 % (07/25 0530) Weight:  [78.019 kg (172 lb)] 78.019 kg (172 lb) (07/24 1115)  Intake/Output from previous day: 07/24 0701 - 07/25 0700 In: 4630 [P.O.:480; I.V.:3050; IV Piggyback:1100] Out: 945 [Urine:915; Drains:15; Blood:15] Intake/Output this shift:    Physical Exam:  General: Alert and oriented. CV: RRR Lungs: Clear bilaterally. GI: Soft, Nondistended. Incisions: Dressings intact. Urine: Clear Extremities: Nontender, no erythema, no edema.  Lab Results:  Recent Labs  02/14/13 1035 02/15/13 0440  HGB 12.1* 10.9*  HCT 38.3* 34.8*      Assessment/Plan: POD# 1 s/p robotic prostatectomy.  1) SL IVF 2) Ambulate, Incentive spirometry 3) Transition to oral pain medication 4) Dulcolax suppository 5) D/C pelvic drain 6) Plan for likely discharge later today   Nathan Schmitt. MD   LOS: 1 day   Nathan Schmitt,LES 02/15/2013, 7:12 AM

## 2013-09-11 IMAGING — CR DG CHEST 2V
2 series · 2 of 2 positions shown · non-contrast
Comparison: None.

CLINICAL DATA: Preop prostatectomy.  Nonsmoker.

CHEST - 2 VIEW

[w chest pa]
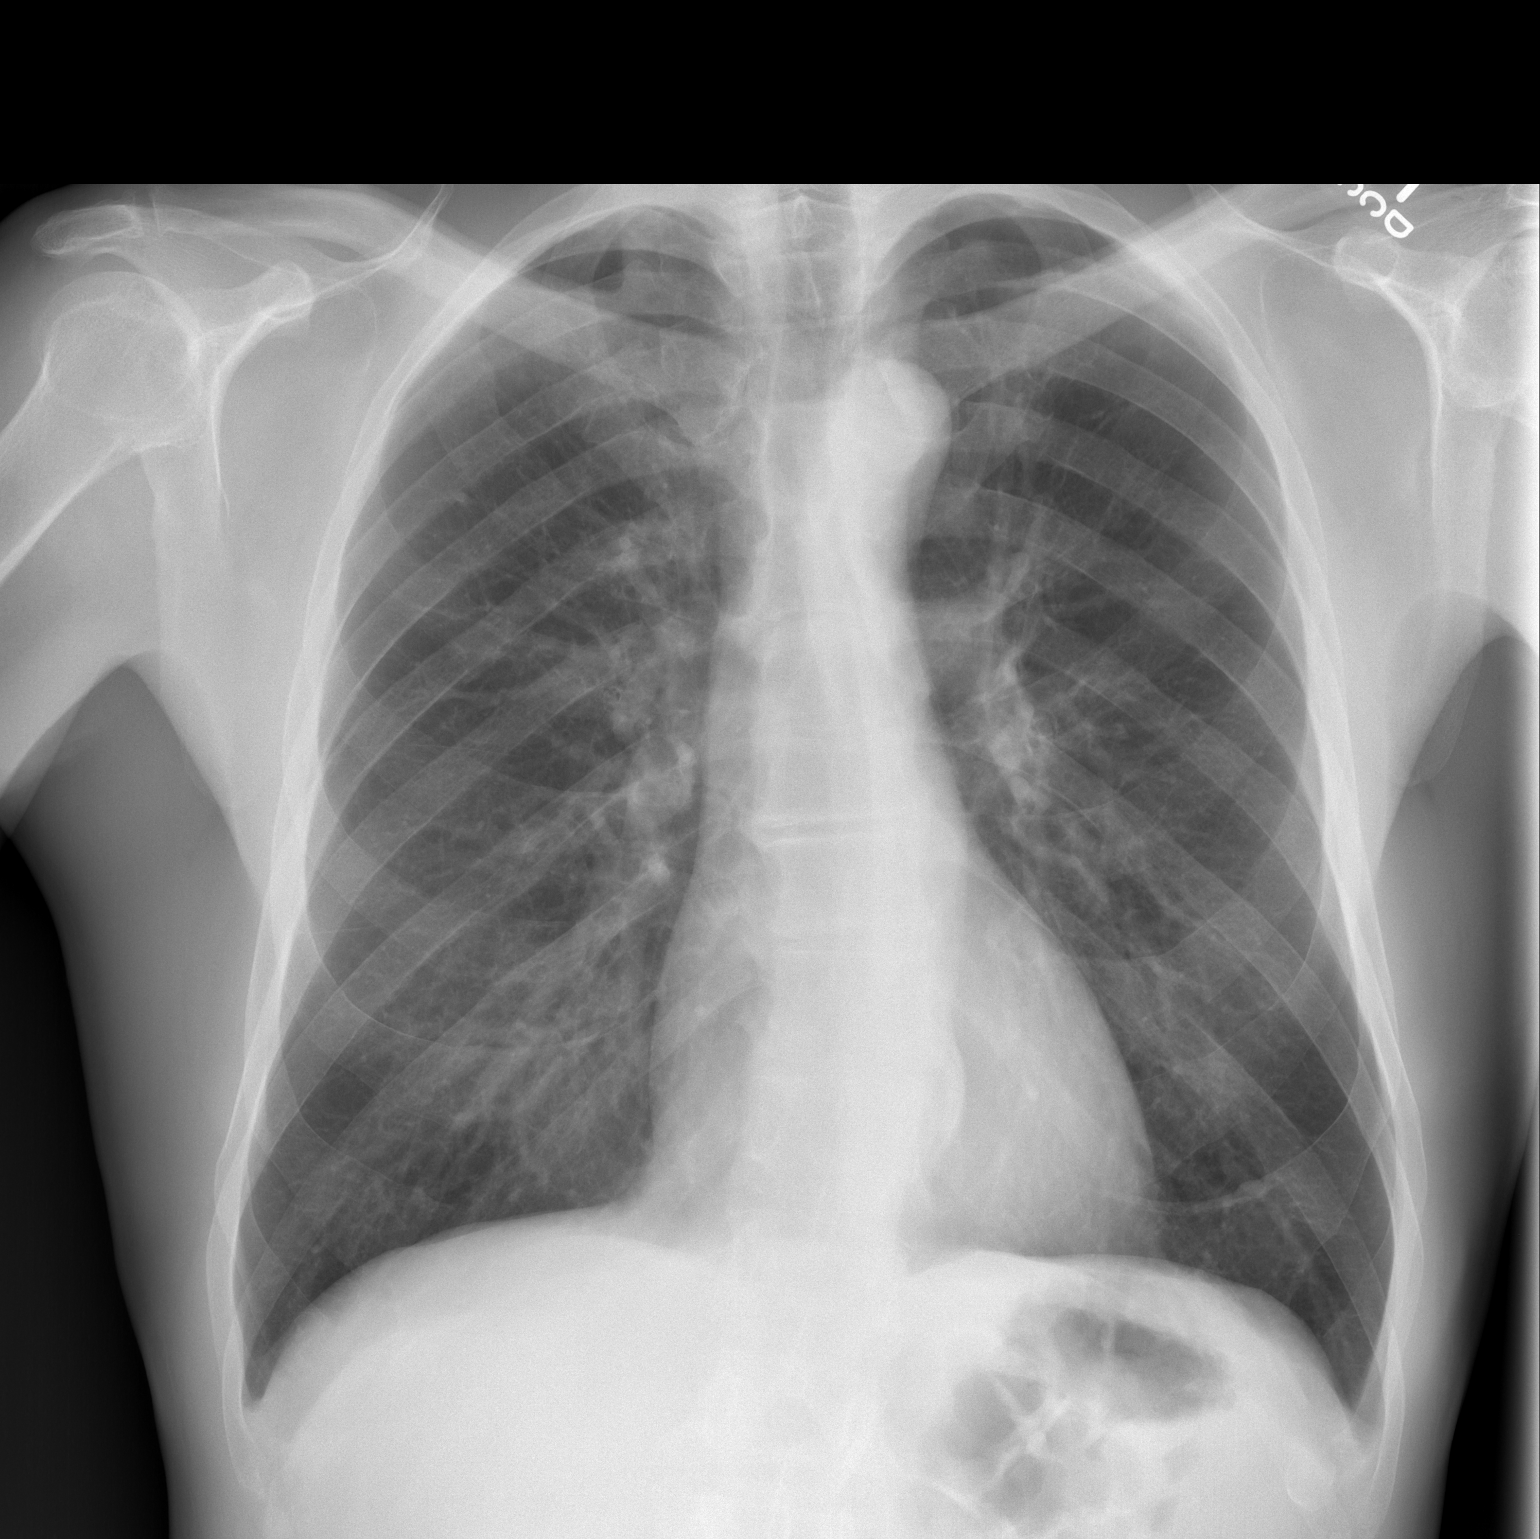

[w chest lat]
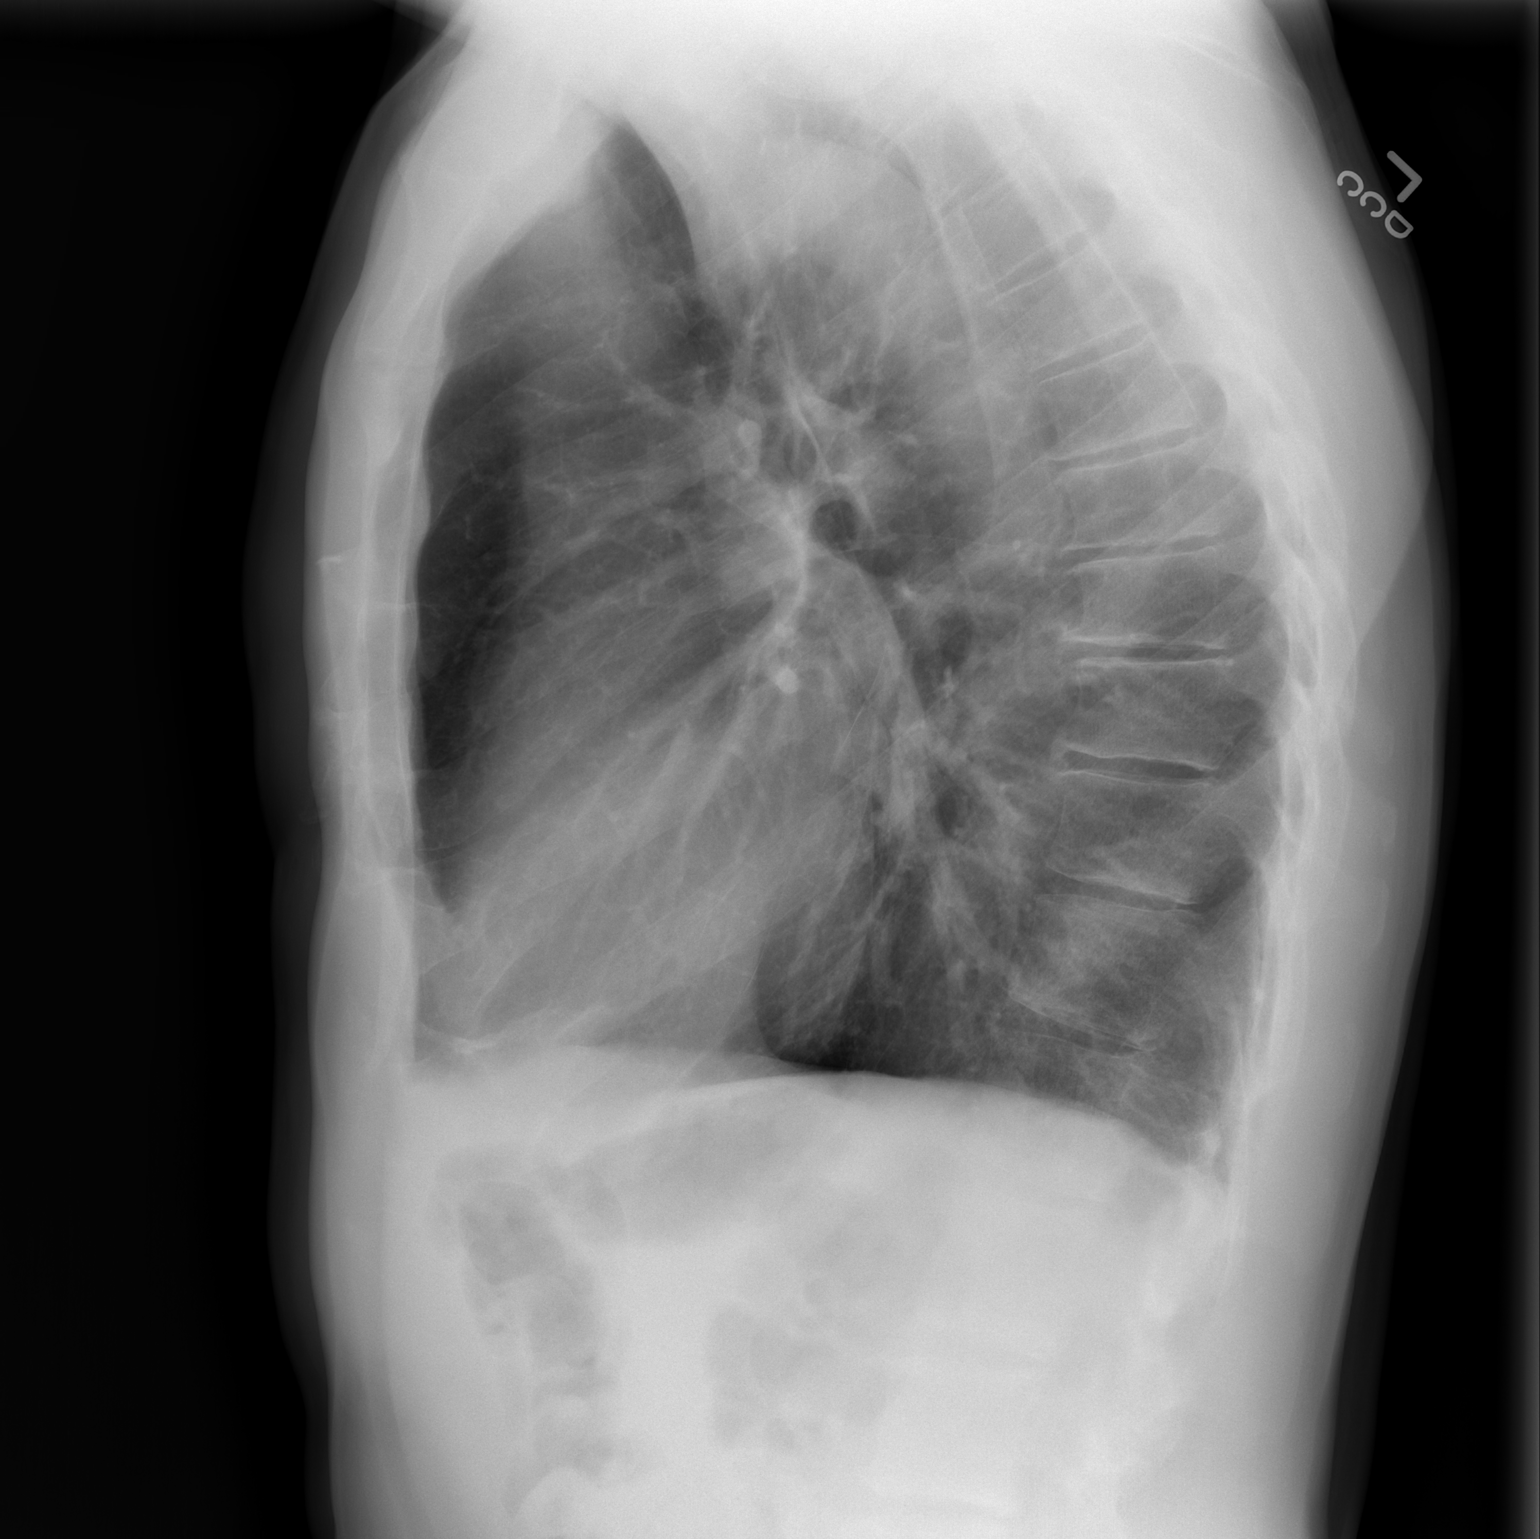

[2 of 2 positions shown; findings below may reference images not displayed]

FINDINGS: Normal heart size with clear lung fields.  No bony
abnormality.
IMPRESSION: Negative exam.

## 2015-07-28 MED FILL — ALLOPURINOL 300 MG TABLET: 300 | 30 days supply | Qty: 30 | Fill #0

## 2015-07-28 MED FILL — CIALIS 5 MG TABLET: 5 | 30 days supply | Qty: 30 | Fill #4

## 2015-07-30 DIAGNOSIS — M1A9XX0 Chronic gout, unspecified, without tophus (tophi): Secondary | ICD-10-CM | POA: Diagnosis not present

## 2015-07-30 DIAGNOSIS — I10 Essential (primary) hypertension: Secondary | ICD-10-CM | POA: Diagnosis not present

## 2015-08-31 MED FILL — ALLOPURINOL 300 MG TABLET: 300 | 30 days supply | Qty: 30 | Fill #1

## 2015-08-31 MED FILL — CIALIS 5 MG TABLET: 5 | 30 days supply | Qty: 30 | Fill #5

## 2015-09-11 MED FILL — LISINOPRIL 10 MG TABLET: 10 | 90 days supply | Qty: 90 | Fill #0

## 2015-09-18 DIAGNOSIS — C61 Malignant neoplasm of prostate: Secondary | ICD-10-CM | POA: Diagnosis not present

## 2015-09-25 DIAGNOSIS — N5201 Erectile dysfunction due to arterial insufficiency: Secondary | ICD-10-CM | POA: Diagnosis not present

## 2015-09-25 DIAGNOSIS — Z0000 Encounter for general adult medical examination without abnormal findings: Secondary | ICD-10-CM | POA: Diagnosis not present

## 2015-09-25 DIAGNOSIS — C61 Malignant neoplasm of prostate: Secondary | ICD-10-CM | POA: Diagnosis not present

## 2015-09-30 MED FILL — CIALIS 5 MG TABLET: 5 | 30 days supply | Qty: 30 | Fill #6

## 2015-09-30 MED FILL — ALLOPURINOL 300 MG TABLET: 300 | 90 days supply | Qty: 90 | Fill #0

## 2015-10-30 MED FILL — CIALIS 5 MG TABLET: 5 | 30 days supply | Qty: 30 | Fill #7

## 2015-12-01 MED FILL — CIALIS 5 MG TABLET: 5 | 30 days supply | Qty: 30 | Fill #8

## 2015-12-14 MED FILL — LISINOPRIL 10 MG TABLET: 10 | 90 days supply | Qty: 90 | Fill #1

## 2015-12-30 MED FILL — CIALIS 5 MG TABLET: 5 | 30 days supply | Qty: 30 | Fill #9

## 2015-12-30 MED FILL — ALLOPURINOL 300 MG TABLET: 300 | 90 days supply | Qty: 90 | Fill #1

## 2016-02-02 DIAGNOSIS — I10 Essential (primary) hypertension: Secondary | ICD-10-CM | POA: Diagnosis not present

## 2016-02-02 DIAGNOSIS — M109 Gout, unspecified: Secondary | ICD-10-CM | POA: Diagnosis not present

## 2016-02-02 DIAGNOSIS — Z23 Encounter for immunization: Secondary | ICD-10-CM | POA: Diagnosis not present

## 2016-02-03 DIAGNOSIS — M1A9XX0 Chronic gout, unspecified, without tophus (tophi): Secondary | ICD-10-CM | POA: Diagnosis not present

## 2016-02-03 DIAGNOSIS — I10 Essential (primary) hypertension: Secondary | ICD-10-CM | POA: Diagnosis not present

## 2016-02-03 DIAGNOSIS — Z0000 Encounter for general adult medical examination without abnormal findings: Secondary | ICD-10-CM | POA: Diagnosis not present

## 2016-02-04 MED FILL — CIALIS 5 MG TABLET: 5 | 30 days supply | Qty: 30 | Fill #10

## 2016-03-14 MED FILL — LISINOPRIL 10 MG TABLET: 10 | 90 days supply | Qty: 90 | Fill #0

## 2016-03-29 MED FILL — ALLOPURINOL 300 MG TABLET: 300 | 90 days supply | Qty: 90 | Fill #0

## 2016-05-04 DIAGNOSIS — C61 Malignant neoplasm of prostate: Secondary | ICD-10-CM | POA: Diagnosis not present

## 2016-05-11 DIAGNOSIS — N5201 Erectile dysfunction due to arterial insufficiency: Secondary | ICD-10-CM | POA: Diagnosis not present

## 2016-05-11 DIAGNOSIS — C61 Malignant neoplasm of prostate: Secondary | ICD-10-CM | POA: Diagnosis not present

## 2016-06-09 MED FILL — LISINOPRIL 10 MG TABLET: 10 | 90 days supply | Qty: 90 | Fill #1

## 2016-06-28 MED FILL — ALLOPURINOL 300 MG TABLET: 300 | 90 days supply | Qty: 90 | Fill #1

## 2016-09-13 MED FILL — LISINOPRIL 10 MG TABLET: 10 | 90 days supply | Qty: 90 | Fill #0

## 2016-10-04 MED FILL — ALLOPURINOL 300 MG TABLET: 300 | 90 days supply | Qty: 90 | Fill #0

## 2016-11-09 DIAGNOSIS — C61 Malignant neoplasm of prostate: Secondary | ICD-10-CM | POA: Diagnosis not present

## 2016-11-16 DIAGNOSIS — C61 Malignant neoplasm of prostate: Secondary | ICD-10-CM | POA: Diagnosis not present

## 2016-12-13 MED FILL — LISINOPRIL 10 MG TABLET: 10 | 90 days supply | Qty: 90 | Fill #1

## 2017-01-09 MED FILL — ALLOPURINOL 300 MG TABLET: 300 | 90 days supply | Qty: 90 | Fill #1

## 2017-02-10 DIAGNOSIS — H1131 Conjunctival hemorrhage, right eye: Secondary | ICD-10-CM | POA: Diagnosis not present

## 2017-02-10 DIAGNOSIS — H578 Other specified disorders of eye and adnexa: Secondary | ICD-10-CM | POA: Diagnosis not present

## 2017-03-14 ENCOUNTER — Telehealth: Payer: Self-pay | Admitting: Medical Oncology

## 2017-03-14 ENCOUNTER — Encounter: Payer: Self-pay | Admitting: Medical Oncology

## 2017-03-14 DIAGNOSIS — C61 Malignant neoplasm of prostate: Secondary | ICD-10-CM | POA: Diagnosis not present

## 2017-03-14 NOTE — Telephone Encounter (Signed)
I called pt to introduce myself as the Prostate Nurse Navigator and the Coordinator of the Prostate [place].  1. I confirmed with the patient he is aware of his referral to the clinic 03/21/17 arriving 12:30-12:45pm.  2. I discussed the format of the clinic and the physicians he will be seeing that day.  3. I discussed where the clinic is located and how to contact me.  4. I confirmed his address and informed him I would be mailing a packet of information and forms to be completed. I asked him to bring them with him the day of his appointment.   He voiced understanding of the above. I asked him to call me if he has any questions or concerns regarding his appointments or the forms he needs to complete.

## 2017-03-16 ENCOUNTER — Telehealth: Payer: Self-pay | Admitting: Medical Oncology

## 2017-03-16 NOTE — Telephone Encounter (Signed)
Nathan Schmitt called asking  me to speak with his wife Nathan Schmitt regarding the [place]. She will be out of town the day of the appointment and has questions. I discussed the format of the clinic and the physicians he will see. All questions were answered and concerns answered.

## 2017-03-17 ENCOUNTER — Encounter: Payer: Self-pay | Admitting: Radiation Oncology

## 2017-03-17 NOTE — Progress Notes (Signed)
GU Location of Tumor / Histology: 3.5 year status post radical prostatectomy with rising PSA  If Prostate Cancer, Gleason Score is (3 + 4) and PSA is (5.83) pre treatment. Surgical margins negative.  08/14/2013 PSA <0.01 09/19/2015 PSA 0.11 05/04/2016 PSA 0.13 11/09/2016 PSA 0.18    Past/Anticipated interventions by urology, if any: prostatectomy, routine PSA assessments, referral to [facility]  Past/Anticipated interventions by medical oncology, if any: no  Weight changes, if any: no  Bowel/Bladder complaints, if any: Maintained continence. Has ED.   Nausea/Vomiting, if any: no  Pain issues, if any:  no  SAFETY ISSUES:  Prior radiation? no  Pacemaker/ICD? no  Possible current pregnancy? no  Is the patient on methotrexate? no  Current Complaints / other details:  66 year old male. Married. Retired.

## 2017-03-20 ENCOUNTER — Telehealth: Payer: Self-pay | Admitting: Medical Oncology

## 2017-03-20 NOTE — Telephone Encounter (Signed)
Spoke with Nathan Schmitt to confirm his appointment for Prostate [facility] 03/21/17 arriving at 12:30pm. He is aware of the location and I reviewed the registration process. He voiced understanding.

## 2017-03-20 NOTE — Progress Notes (Signed)
Requested pathology slides for Prostate [facility] 03/21/17.

## 2017-03-21 ENCOUNTER — Ambulatory Visit
Admission: RE | Admit: 2017-03-21 | Discharge: 2017-03-21 | Disposition: A | Discharge status: Home / Self Care | Payer: PPO | Source: Ambulatory Visit | Attending: Radiation Oncology | Admitting: Radiation Oncology

## 2017-03-21 ENCOUNTER — Telehealth: Payer: Self-pay | Admitting: Medical Oncology

## 2017-03-21 ENCOUNTER — Encounter: Payer: Self-pay | Admitting: Medical Oncology

## 2017-03-21 ENCOUNTER — Encounter (INDEPENDENT_AMBULATORY_CARE_PROVIDER_SITE_OTHER): Payer: Self-pay

## 2017-03-21 VITALS — BP 139/93 | HR 63 | Temp 98.0°F | Resp 18 | Ht 73.0 in | Wt 166.6 lb

## 2017-03-21 DIAGNOSIS — Z9889 Other specified postprocedural states: Secondary | ICD-10-CM | POA: Diagnosis not present

## 2017-03-21 DIAGNOSIS — Z9079 Acquired absence of other genital organ(s): Secondary | ICD-10-CM | POA: Insufficient documentation

## 2017-03-21 DIAGNOSIS — Z880 Allergy status to penicillin: Secondary | ICD-10-CM | POA: Insufficient documentation

## 2017-03-21 DIAGNOSIS — C61 Malignant neoplasm of prostate: Secondary | ICD-10-CM | POA: Insufficient documentation

## 2017-03-21 DIAGNOSIS — R9721 Rising PSA following treatment for malignant neoplasm of prostate: Secondary | ICD-10-CM | POA: Diagnosis not present

## 2017-03-21 HISTORY — DX: Malignant neoplasm of prostate: C61

## 2017-03-21 NOTE — Consult Note (Signed)
[facility]     03/21/2017     --------------------------------------------------------------------------------     Nathan Schmitt   MRN: [id number]  PRIMARY CARE:  Alford Highland, MD   DOB: 1950-10-04, 66 year old Male  REFERRING:  Richard L. Kendall Flack, MD   SSN:   PROVIDER:  [name], M.D.     LOCATION:  [facility] [phone]     --------------------------------------------------------------------------------     CC/HPI: Prostate cancer     Location of visit: [place]     [name] follows up today for further evaluation in the prostate cancer multidisciplinary clinic for his rising PSA after radical prostatectomy. He continues to have excellent continence and denies any new voiding complaints. He remains in excellent health overall. His wife, who is an NP, is unfortunately out of town and was note available for today's appointment.        ALLERGIES: Penicillins       MEDICATIONS: Allopurinol 300 MG Oral Tablet Oral   Cialis 5 mg tablet 0 Oral   Lisinopril 10 MG Oral Tablet Oral   Multi Vitamin/Minerals TABS Oral        GU PSH: Laparoscopy; Lymphadenectomy - 2014  Robotic Radical Prostatectomy - 2014         PSH Notes: Laparoscopy With Bilateral Total Pelvic Lymphadenectomy, Prostatect Retropubic Radical W/ Nerve Sparing Laparoscopic, Hand Surgery, Repair Of Retinal Detachment     NON-GU PSH: None     GU PMH: ED due to arterial insufficiency, Erectile dysfunction due to arterial insufficiency - 2017  Prostate Cancer, Prostate cancer - 2017         PMH Notes:     1) Prostate cancer: He is s/p a BNS RAL radical prostatectomy and BPLND on 02/14/13.     Diagnosis:pT2c N0 Mx, Gleason 3+4=7 adenocarcinoma with negative surgical margins   Pretreatment PSA: 5.83   Pretreatment SHIM: 22        NON-GU PMH: Gout  Hypertension       FAMILY HISTORY: Breast Cancer - Mother, Sister  Father  Deceased At Age40 ___ - Runs In Family  Prostate Cancer - Father     SOCIAL HISTORY: Marital Status: Married  Preferred Language: English; Ethnicity: Not Hispanic Or Latino; Race: White  Current Smoking Status: Patient has never smoked.     Tobacco Use Assessment Completed: Used Tobacco in last 30 days?  Does drink.   Drinks 1 caffeinated drink per day.       Notes: Tobacco use, Living Independently With Spouse, Alcohol Use, Never A Smoker, Marital History - Currently Married, Occupation: Retired     REVIEW OF SYSTEMS:     GU Review Male:   Patient denies frequent urination, hard to postpone urination, burning/ pain with urination, get up at night to urinate, leakage of urine, stream starts and stops, trouble starting your streams, and have to strain to urinate .   Gastrointestinal (Lower):   Patient denies diarrhea and constipation.   Gastrointestinal (Upper):   Patient denies nausea and vomiting.   Constitutional:   Patient denies fatigue, night sweats, weight loss, and fever.   Skin:   Patient denies skin rash/ lesion and itching.   Eyes:   Patient denies blurred vision and double vision.   Ears/ Nose/ Throat:   Patient denies sore throat and sinus problems.   Hematologic/Lymphatic:   Patient denies swollen glands and easy bruising.   Cardiovascular:  Patient denies leg swelling and chest pains.   Respiratory:   Patient denies cough and shortness of breath.   Endocrine:   Patient denies excessive thirst.   Musculoskeletal:   Patient denies back pain and joint pain.   Neurological:   Patient denies headaches and dizziness.   Psychologic:   Patient denies depression and anxiety.     VITAL SIGNS: None     MULTI-SYSTEM PHYSICAL EXAMINATION:     Constitutional: Well-nourished. No physical deformities. Normally developed. Good grooming.        PAST DATA REVIEWED:   Source Of History:  Patient   Lab Test Review:   PSA   Records Review:   Pathology Reports, Previous Patient Records     03/14/17 11/09/16 05/04/16 09/19/15 03/12/15 12/04/14 08/28/14 02/20/14   PSA   Total PSA 0.17 ng/mL 0.18 ng/dl 0.13 ng/dl 0.11  0.07  0.06  0.05  0.03        PROCEDURES: None     ASSESSMENT:       ICD-10 Details   1 GU:   Prostate Cancer - C61      PLAN:             Document  Letter(s):  Created for Patient: Clinical Summary            Notes:   1. Prostate cancer: We reviewed his PSA which is relatively stable. He understands that he is likely developing a very slow biochemical relapse although has not quite met the definition for PSA recurrence just yet. Although he has no strong risk factors for local recurrence from his radical prostatectomy specimen, he does have a very slow increase in his PSA, relatively long time to recurrence, and he has an excellent life expectancy - all of which would suggest potential benefit for salvage radiation therapy.     We reviewed the pros and cons of salvage radiation therapy today and he is scheduled to see Dr. Tammi Klippel for further discussion later this afternoon.     Currently, he will be scheduled for a PSA with me in 3 months and 6 months and for an OV in 6 months at the very least pending his PSA result. He appears fully prepared to proceed with salvage radiation therapy if his PSA continues to rise.     cc: Dr. Tyler Pita   Elbert Ewings, FNP       E & M CODE: I spent at least 25 minutes face to face with the patient, more than 50% of that time was spent on counseling and/or coordinating care.

## 2017-03-21 NOTE — Telephone Encounter (Signed)
[name] was unclear is would see Dr. Alinda Money with his PSA lab in 3 months. I informed him per Dr. Alinda Money, if his PSA remains stable in 3 months he will call him with results and follow up with another PSA and MD visit in 3 months. If the PSA rises then we will schedule him with Dr. Tammi Klippel to start radiation. He voiced understanding.

## 2017-03-21 NOTE — Progress Notes (Signed)
Radiation Oncology         [phone] ________________________________  Multidisciplinary Prostate Cancer Clinic  Initial Radiation Oncology Consultation  Name: Nathan Schmitt MRN: [id number]  Date: 03/21/2017  DOB: Oct 13, 1950  WN:IOEVO, Cristi Loron, MD  [name], MD   REFERRING PHYSICIAN: [name], MD  DIAGNOSIS: 65 y.o. gentleman with history of stage pT2cN0M0 adenocarcinoma of the prostate with a Gleason's score of 3+4 and a pre-treatment PSA of 5.83, 3.5 years s/p RALP with post-treatment PSA 0.18.    ICD-10-CM   1. Prostate cancer ([place]) C61     HISTORY OF PRESENT ILLNESS::Nathan Schmitt is a 66 y.o. gentleman with a history of stage pT2cN0M0 adenocarcinoma of the prostate with a Gleason's score of 3+4 and a pre-treatment PSA of 5.83.  He underwent a BNS RALP and BPLND on 02/14/13.  Pathology revealed negative surgical margins.  Initial post-treatment PSA was undetectable but he has been noted to have a gradual return of PSA since that time.  He has regained full continence and denies any new voiding complaints or changes in his overall health. Out of the 12 biopsies, 4 found in the right lateral apex (3+3), right apex (3+4), left lateral medial (3+3), and left lateral apex (3+3). Prostate tissue with small focus of atypical glands in the right lateral base. 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    PSA 12/14/12 - 5.83 04/17/12 - 4.91  Post-op PSA Trend: 08/14/2013        PSA     <0.01 02/20/2014 PSA 0.03 08/28/2014 PSA 0.05 03/02/2015 PSA 0.07  09/19/2015        PSA     0.11 05/04/2016      PSA     0.13 11/09/2016      PSA     0.18 03/14/17 PSA 0.17  The patient reviewed the PSA results with his urologist and he has kindly been referred today to the multidisciplinary prostate cancer clinic for presentation of pathology and radiology studies in our conference for discussion of potential radiation treatment options and clinical  evaluation.   PREVIOUS RADIATION THERAPY: No  PAST MEDICAL HISTORY:  has a past medical history of Cancer ([place]); Gout; Hypertension; and Prostate cancer ([place]).    PAST SURGICAL HISTORY: Past Surgical History:  Procedure Laterality Date  . FINGER SURGERY  2005   removal cyst 5th finger left hand  . RETINAL DETACHMENT SURGERY  1977   left  . ROBOT ASSISTED LAPAROSCOPIC RADICAL PROSTATECTOMY N/A 02/14/2013   Procedure: ROBOTIC ASSISTED LAPAROSCOPIC RADICAL PROSTATECTOMY  WITH BILATERAL NODE DISSECTIONS;  Surgeon: Dutch Gray, MD;  Location: [facility];  Service: Urology;  Laterality: N/A;    FAMILY HISTORY: family history includes Cancer in his mother and sister; Colon cancer (age of onset: 13) in his maternal uncle; Prostate cancer (age of onset: 29) in his father.  SOCIAL HISTORY:  reports that he has never smoked. He quit smokeless tobacco use about 11 years ago. His smokeless tobacco use included Chew. He reports that he drinks alcohol. He reports that he does not use drugs.  ALLERGIES: Penicillins  MEDICATIONS:  Current Outpatient Prescriptions  Medication Sig Dispense Refill  . allopurinol (ZYLOPRIM) 300 MG tablet Take 300 mg by mouth daily.    Marland Kitchen lisinopril (PRINIVIL,ZESTRIL) 10 MG tablet Take 10 mg by mouth every evening.     . Multiple Vitamin (MULTI-VITAMINS) TABS Take by mouth.    . tadalafil (CIALIS) 5 MG tablet      No current  facility-administered medications for this encounter.     REVIEW OF SYSTEMS:  On review of systems, the patient reports that he is doing well overall. He denies any chest pain, shortness of breath, cough, fevers, chills, night sweats, unintended weight changes. He denies any bowel disturbances, and denies abdominal pain, nausea or vomiting. He denies any new musculoskeletal or joint aches or pains. His IPSS was 1, indicating mild urinary symptoms. He has regained full urinary continence and denies gross hematuria, dysuria, frequency, urgency or excessive  nocturia.  He is unable to complete sexual activity with a score of 5 indicating severe ED. A complete review of systems is obtained and is otherwise negative.   PHYSICAL EXAM:  Wt Readings from Last 3 Encounters:  03/21/17 166 lb 9.6 oz (75.6 kg)  02/14/13 172 lb (78 kg)  02/08/13 172 lb (78 kg)   Temp Readings from Last 3 Encounters:  03/21/17 98 F (36.7 C) (Oral)  02/15/13 97.8 F (36.6 C) (Oral)  02/08/13 98.2 F (36.8 C) (Oral)   BP Readings from Last 3 Encounters:  03/21/17 (!) 139/93  02/15/13 104/63  02/08/13 (!) 145/88   Pulse Readings from Last 3 Encounters:  03/21/17 63  02/15/13 66  02/08/13 63   Pain Assessment Pain Score: 0-No pain/10  In general this is a well appearing  in no acute distress. He is alert and oriented x4 and appropriate throughout the examination. HEENT reveals that the patient is normocephalic, atraumatic. EOMs are intact. PERRLA. Skin is intact without any evidence of gross lesions. Cardiovascular exam reveals a regular rate and rhythm, no clicks rubs or murmurs are auscultated. Chest is clear to auscultation bilaterally. Lymphatic assessment is performed and does not reveal any adenopathy in the cervical, supraclavicular, axillary, or inguinal chains. Abdomen has active bowel sounds in all quadrants and is intact. The abdomen is soft, non tender, non distended. Lower extremities are negative for pretibial pitting edema, deep calf tenderness, cyanosis or clubbing.  KPS = 100  100 - Normal; no complaints; no evidence of disease. 90   - Able to carry on normal activity; minor signs or symptoms of disease. 80   - Normal activity with effort; some signs or symptoms of disease. 50   - Cares for self; unable to carry on normal activity or to do active work. 60   - Requires occasional assistance, but is able to care for most of his personal needs. 50   - Requires considerable assistance and frequent medical care. 7   - Disabled; requires special care  and assistance. 69   - Severely disabled; hospital admission is indicated although death not imminent. 33   - Very sick; hospital admission necessary; active supportive treatment necessary. 10   - Moribund; fatal processes progressing rapidly. 0     - Dead  Karnofsky DA, Abelmann [place], Craver LS and Burchenal [facility] [phone]) The use of the nitrogen mustards in the palliative treatment of carcinoma: with particular reference to bronchogenic carcinoma Cancer 1 [id number]   LABORATORY DATA:  Lab Results  Component Value Date   WBC 4.8 02/08/2013   HGB 10.9 (L) 02/15/2013   HCT 34.8 (L) 02/15/2013   MCV 88.3 02/08/2013   PLT 233 02/08/2013   Lab Results  Component Value Date   NA 139 02/08/2013   K 4.3 02/08/2013   CL 102 02/08/2013   CO2 30 02/08/2013   No results found for: ALT, AST, GGT, ALKPHOS, BILITOT   RADIOGRAPHY: No results found.  IMPRESSION/PLAN: 66 y.o. gentleman with a history of stage pT2cN0M0 adenocarcinoma of the prostate with a Gleason's score of 3+4 and a pre-treatment PSA of 5.83, 3.5 years s/p RALP with post-treatment PSA 0.18. Today I reviewed the findings and workup thus far.  We discussed the natural history of prostate cancer.  We reviewed the  implications of detectable rising post-op PSA.   We discussed radiation treatment directed to the prostatic fossa with regard to the logistics and delivery of external beam radiation treatment.    The patient would like to proceed with PSA surveillance at this time and would be interested in moving forward with salvage radiotherapy if the PSA continues to rise.  He is planning to have a repeat PSA drawn in 3 months and a planned follow-up with Dr. Alinda Money in 6 months or sooner pending PSA results.  We will share our findings with Dr. Alinda Money and will look forward to following along in his progress.     I enjoyed meeting with him today, and look forward to participating in the care of this very nice gentleman.    ------------------------------------------------   Nicholos Johns, PA-C    Tyler Pita, MD  [place]: [phone]  Fax: [phone] [email or web address]  Skype  LinkedIn  This document serves as a record of services personally performed by Tyler Pita, MD and Ashlyn Bruning PA-C. It was created on their behalf by Delton Coombes, a trained medical scribe. The creation of this record is based on the scribe's personal observations and the provider's statements to them. This document has been checked and approved by the attending provider.

## 2017-03-21 NOTE — Progress Notes (Signed)
                               Care Plan Summary  Name: [name] DOB: April 12, 1951 Your Medical Team:   Urologist -  Dr. [name], [facility]  Radiation Oncologist - Dr. Tyler Pita, [facility]   Medical Oncologist - Dr. Zola Button, [place]  Recommendations: 1) check PSA in 3 months 2) If PSA remains stable, repeat in 3 months. If PSA continues to rise move forward with radiation    * These recommendations are based on information available as of today's consult.      Recommendations may change depending on the results of further tests or exams  Next Steps: 1) Labs at [facility] in 3 months. Dr. Lynne Logan office will call to schedule.  When appointments need to be scheduled, you will be contacted by [facility] and/or [facility].  Questions?  Please do not hesitate to call Nathan Rue, RN, BSN, OCN at [phone] any questions or concerns.  Nathan Schmitt is your Oncology Nurse Navigator and is available to assist you while you're receiving your medical care at [facility].   Patient given business cards for all providers and a copy of "Falls Prevention Safety Sheet".

## 2017-03-21 NOTE — Telephone Encounter (Signed)
Left a message for Nathan Schmitt -patients wife with a summary of [place] visit. She was unable to attend and she has asked if I could call her after the clinic. I asked Nathan Schmitt if he was ok with me calling and he stated yes that I could explain it better than he. I did ask her to call if she had questions or concerns.

## 2017-03-22 ENCOUNTER — Encounter: Payer: Self-pay | Admitting: General Practice

## 2017-03-22 NOTE — Progress Notes (Signed)
[facility] Psychosocial Distress Screening Spiritual Care  Met with Nathan Schmitt in Breast Multidisciplinary Clinic to introduce Support Center team/resources, reviewing distress screen per protocol.  The patient scored a 1 on the Psychosocial Distress Thermometer which indicates mild distress. Also assessed for distress and other psychosocial needs.   ONCBCN DISTRESS SCREENING 03/22/2017  Screening Type Initial Screening  Distress experienced in past week (1-10) 1  Physical Problem type Sexual problems  Referral to support programs Yes   Nathan Schmitt approaches this process with a matter-of-fact attitude:  "Why worry about what I can't control?" He reports good support from his wife, who is a nurse practitioner.  Follow up needed: No.  Per pt, no other needs at this time. He has full packet of Support Center team/programming info and knows to call anytime with needs or questions.   Chaplain Lisa [place], MDiv, [facility] Pager [phone] Voicemail [phone]      

## 2017-03-23 DIAGNOSIS — Z9079 Acquired absence of other genital organ(s): Secondary | ICD-10-CM | POA: Diagnosis not present

## 2017-03-23 DIAGNOSIS — Z8546 Personal history of malignant neoplasm of prostate: Secondary | ICD-10-CM | POA: Diagnosis not present

## 2017-03-23 DIAGNOSIS — M1A9XX0 Chronic gout, unspecified, without tophus (tophi): Secondary | ICD-10-CM | POA: Diagnosis not present

## 2017-03-23 DIAGNOSIS — I10 Essential (primary) hypertension: Secondary | ICD-10-CM | POA: Diagnosis not present

## 2017-03-23 MED FILL — LISINOPRIL 10 MG TABLET: 10 | 90 days supply | Qty: 90 | Fill #0

## 2017-03-23 MED FILL — ALLOPURINOL 300 MG TABLET: 300 | 90 days supply | Qty: 90 | Fill #0

## 2017-03-24 MED FILL — SILDENAFIL 20 MG TABLET: 20 | 10 days supply | Qty: 10 | Fill #0

## 2017-04-03 ENCOUNTER — Telehealth: Payer: Self-pay | Admitting: Medical Oncology

## 2017-04-03 NOTE — Telephone Encounter (Signed)
Spoke with Nathan Schmitt to follow up post Prostate MDC. He states that he did receive an appointment for PSA lab draw 06/22/17 at [facility]. He states he is doing well and I encouraged him to call with questions or concerns. He voiced understanding.

## 2017-06-22 DIAGNOSIS — C61 Malignant neoplasm of prostate: Secondary | ICD-10-CM | POA: Diagnosis not present

## 2017-06-22 MED FILL — ALLOPURINOL 300 MG TABS: 300 | 90 days supply | Qty: 90 | Fill #1

## 2017-06-22 MED FILL — LISINOPRIL 10 MG TABLET: 10 | 90 days supply | Qty: 90 | Fill #1

## 2017-06-29 ENCOUNTER — Encounter: Payer: Self-pay | Admitting: Urology

## 2017-06-29 NOTE — Progress Notes (Signed)
I called and spoke with Dr. Lynne Logan nurse regarding follow-up PSA for patient. His most recent PSA was performed on 06/22/2017 and was 0.14. He is planning a repeat PSA in 3 months and follow-up with Dr. Alinda Money at that time. If his PSA is 0.20 or greater at that time, he will be referred back for further discussion regarding salvage radiotherapy.   Nicholos Johns, PA-C

## 2017-07-31 DIAGNOSIS — S80862A Insect bite (nonvenomous), left lower leg, initial encounter: Secondary | ICD-10-CM | POA: Diagnosis not present

## 2017-07-31 DIAGNOSIS — W57XXXA Bitten or stung by nonvenomous insect and other nonvenomous arthropods, initial encounter: Secondary | ICD-10-CM | POA: Diagnosis not present

## 2017-07-31 MED FILL — DOXYCYCLINE HYCLATE 100 MG: 100 | 1 days supply | Qty: 2 | Fill #0

## 2017-09-21 DIAGNOSIS — M1A9XX0 Chronic gout, unspecified, without tophus (tophi): Secondary | ICD-10-CM | POA: Diagnosis not present

## 2017-09-21 DIAGNOSIS — I10 Essential (primary) hypertension: Secondary | ICD-10-CM | POA: Diagnosis not present

## 2017-09-21 DIAGNOSIS — Z8546 Personal history of malignant neoplasm of prostate: Secondary | ICD-10-CM | POA: Diagnosis not present

## 2017-09-21 DIAGNOSIS — T560X1D Toxic effect of lead and its compounds, accidental (unintentional), subsequent encounter: Secondary | ICD-10-CM | POA: Diagnosis not present

## 2017-09-21 MED FILL — ALLOPURINOL 300 MG TABS: 300 | 90 days supply | Qty: 90 | Fill #0

## 2017-09-21 MED FILL — LISINOPRIL 10 MG TABS: 10 | 90 days supply | Qty: 90 | Fill #0

## 2017-10-20 DIAGNOSIS — C61 Malignant neoplasm of prostate: Secondary | ICD-10-CM | POA: Diagnosis not present

## 2017-10-27 DIAGNOSIS — C61 Malignant neoplasm of prostate: Secondary | ICD-10-CM | POA: Diagnosis not present

## 2017-10-27 DIAGNOSIS — N5201 Erectile dysfunction due to arterial insufficiency: Secondary | ICD-10-CM | POA: Diagnosis not present

## 2017-12-25 MED FILL — LISINOPRIL 10 MG TABLET: 10 | 90 days supply | Qty: 90 | Fill #1

## 2017-12-25 MED FILL — ALLOPURINOL 300 MG TABS: 300 | 90 days supply | Qty: 90 | Fill #1

## 2018-03-29 MED FILL — ALLOPURINOL 300 MG TABLET: 300 | 90 days supply | Qty: 90 | Fill #0

## 2018-03-29 MED FILL — LISINOPRIL 10 MG TABLET: 10 | 90 days supply | Qty: 90 | Fill #0

## 2018-04-10 DIAGNOSIS — Z5181 Encounter for therapeutic drug level monitoring: Secondary | ICD-10-CM | POA: Diagnosis not present

## 2018-04-10 DIAGNOSIS — M1A09X0 Idiopathic chronic gout, multiple sites, without tophus (tophi): Secondary | ICD-10-CM | POA: Diagnosis not present

## 2018-04-10 DIAGNOSIS — I10 Essential (primary) hypertension: Secondary | ICD-10-CM | POA: Diagnosis not present

## 2018-04-10 DIAGNOSIS — Z0000 Encounter for general adult medical examination without abnormal findings: Secondary | ICD-10-CM | POA: Diagnosis not present

## 2018-04-10 DIAGNOSIS — Z8546 Personal history of malignant neoplasm of prostate: Secondary | ICD-10-CM | POA: Diagnosis not present

## 2018-06-06 DIAGNOSIS — C61 Malignant neoplasm of prostate: Secondary | ICD-10-CM | POA: Diagnosis not present

## 2018-06-13 DIAGNOSIS — N5201 Erectile dysfunction due to arterial insufficiency: Secondary | ICD-10-CM | POA: Diagnosis not present

## 2018-06-13 DIAGNOSIS — C61 Malignant neoplasm of prostate: Secondary | ICD-10-CM | POA: Diagnosis not present

## 2018-07-03 MED FILL — LISINOPRIL 10 MG TABS: 10 | 90 days supply | Qty: 90 | Fill #1

## 2018-07-03 MED FILL — ALLOPURINOL 300 MG TABLET: 300 | 90 days supply | Qty: 90 | Fill #1

## 2018-10-03 MED FILL — SILDENAFIL CITRATE 20 MG TA: 20 | 6 days supply | Qty: 30 | Fill #0

## 2018-10-03 MED FILL — ALLOPURINOL 300 MG TABLET: 300 | 90 days supply | Qty: 90 | Fill #0

## 2018-10-03 MED FILL — LISINOPRIL 10 MG TABLET: 10 | 90 days supply | Qty: 90 | Fill #0

## 2018-10-09 DIAGNOSIS — Z5181 Encounter for therapeutic drug level monitoring: Secondary | ICD-10-CM | POA: Diagnosis not present

## 2018-10-09 DIAGNOSIS — I10 Essential (primary) hypertension: Secondary | ICD-10-CM | POA: Diagnosis not present

## 2018-10-09 DIAGNOSIS — M1A9XX0 Chronic gout, unspecified, without tophus (tophi): Secondary | ICD-10-CM | POA: Diagnosis not present

## 2018-10-09 DIAGNOSIS — Z79899 Other long term (current) drug therapy: Secondary | ICD-10-CM | POA: Diagnosis not present

## 2018-10-09 DIAGNOSIS — Z8546 Personal history of malignant neoplasm of prostate: Secondary | ICD-10-CM | POA: Diagnosis not present

## 2018-10-09 DIAGNOSIS — Z0000 Encounter for general adult medical examination without abnormal findings: Secondary | ICD-10-CM | POA: Diagnosis not present

## 2018-12-12 DIAGNOSIS — C61 Malignant neoplasm of prostate: Secondary | ICD-10-CM | POA: Diagnosis not present

## 2018-12-19 DIAGNOSIS — N5201 Erectile dysfunction due to arterial insufficiency: Secondary | ICD-10-CM | POA: Diagnosis not present

## 2018-12-19 DIAGNOSIS — C61 Malignant neoplasm of prostate: Secondary | ICD-10-CM | POA: Diagnosis not present

## 2019-01-09 MED FILL — LISINOPRIL 10 MG TABLET: 10 | 90 days supply | Qty: 90 | Fill #1

## 2019-01-09 MED FILL — ALLOPURINOL 300 MG TABLET: 300 | 90 days supply | Qty: 90 | Fill #1

## 2019-01-16 DIAGNOSIS — H5213 Myopia, bilateral: Secondary | ICD-10-CM | POA: Diagnosis not present

## 2019-01-16 DIAGNOSIS — H524 Presbyopia: Secondary | ICD-10-CM | POA: Diagnosis not present

## 2019-04-10 DIAGNOSIS — M1A9XX0 Chronic gout, unspecified, without tophus (tophi): Secondary | ICD-10-CM | POA: Diagnosis not present

## 2019-04-10 DIAGNOSIS — Z8546 Personal history of malignant neoplasm of prostate: Secondary | ICD-10-CM | POA: Diagnosis not present

## 2019-04-10 DIAGNOSIS — I10 Essential (primary) hypertension: Secondary | ICD-10-CM | POA: Diagnosis not present

## 2019-04-10 DIAGNOSIS — D649 Anemia, unspecified: Secondary | ICD-10-CM | POA: Diagnosis not present

## 2019-04-10 DIAGNOSIS — Z5181 Encounter for therapeutic drug level monitoring: Secondary | ICD-10-CM | POA: Diagnosis not present

## 2019-04-10 DIAGNOSIS — Z0000 Encounter for general adult medical examination without abnormal findings: Secondary | ICD-10-CM | POA: Diagnosis not present

## 2019-04-10 DIAGNOSIS — D509 Iron deficiency anemia, unspecified: Secondary | ICD-10-CM | POA: Diagnosis not present

## 2019-04-10 MED FILL — LISINOPRIL 10 MG TABS: 10 | 90 days supply | Qty: 90 | Fill #0

## 2019-04-10 MED FILL — ALLOPURINOL 300 MG TABLET: 300 | 90 days supply | Qty: 90 | Fill #0

## 2019-04-10 MED FILL — FERROUS SULFATE 325 MG TAB: 325 (65 FE) | 100 days supply | Qty: 100 | Fill #0

## 2019-04-11 MED FILL — FLUAD QUADRIVALENT 0.5 ML P: 0.5 | 1 days supply | Qty: 1 | Fill #0

## 2019-05-31 ENCOUNTER — Encounter: Payer: Self-pay | Admitting: Internal Medicine

## 2019-07-12 MED FILL — LISINOPRIL 10 MG TABS: 10 | 90 days supply | Qty: 90 | Fill #1

## 2019-07-12 MED FILL — ALLOPURINOL 300 MG TABLET: 300 | 90 days supply | Qty: 90 | Fill #1

## 2019-08-07 DIAGNOSIS — C61 Malignant neoplasm of prostate: Secondary | ICD-10-CM | POA: Diagnosis not present

## 2019-08-14 DIAGNOSIS — C61 Malignant neoplasm of prostate: Secondary | ICD-10-CM | POA: Diagnosis not present

## 2019-10-10 MED FILL — ALLOPURINOL 300 MG TABLET: 300 | 90 days supply | Qty: 90 | Fill #0

## 2019-10-10 MED FILL — LISINOPRIL 10 MG TABS: 10 | 90 days supply | Qty: 90 | Fill #0

## 2019-10-23 DIAGNOSIS — I459 Conduction disorder, unspecified: Secondary | ICD-10-CM | POA: Diagnosis not present

## 2019-10-23 DIAGNOSIS — D509 Iron deficiency anemia, unspecified: Secondary | ICD-10-CM | POA: Diagnosis not present

## 2019-10-23 DIAGNOSIS — I10 Essential (primary) hypertension: Secondary | ICD-10-CM | POA: Diagnosis not present

## 2019-10-23 DIAGNOSIS — R001 Bradycardia, unspecified: Secondary | ICD-10-CM | POA: Diagnosis not present

## 2019-10-23 DIAGNOSIS — Z5181 Encounter for therapeutic drug level monitoring: Secondary | ICD-10-CM | POA: Diagnosis not present

## 2019-10-23 DIAGNOSIS — Z0000 Encounter for general adult medical examination without abnormal findings: Secondary | ICD-10-CM | POA: Diagnosis not present

## 2019-10-23 DIAGNOSIS — I493 Ventricular premature depolarization: Secondary | ICD-10-CM | POA: Diagnosis not present

## 2019-10-23 DIAGNOSIS — Z1321 Encounter for screening for nutritional disorder: Secondary | ICD-10-CM | POA: Diagnosis not present

## 2019-11-12 DIAGNOSIS — C61 Malignant neoplasm of prostate: Secondary | ICD-10-CM | POA: Diagnosis not present

## 2019-11-22 ENCOUNTER — Ambulatory Visit: Payer: PPO

## 2019-11-22 ENCOUNTER — Ambulatory Visit: Payer: PPO | Admitting: Urology

## 2019-11-29 ENCOUNTER — Encounter: Payer: Self-pay | Admitting: Urology

## 2019-11-29 ENCOUNTER — Other Ambulatory Visit: Payer: Self-pay

## 2019-11-29 ENCOUNTER — Ambulatory Visit
Admission: RE | Admit: 2019-11-29 | Discharge: 2019-11-29 | Disposition: A | Discharge status: Home / Self Care | Payer: PPO | Source: Ambulatory Visit | Attending: Radiation Oncology | Admitting: Radiation Oncology

## 2019-11-29 DIAGNOSIS — Z9079 Acquired absence of other genital organ(s): Secondary | ICD-10-CM | POA: Diagnosis not present

## 2019-11-29 DIAGNOSIS — R9721 Rising PSA following treatment for malignant neoplasm of prostate: Secondary | ICD-10-CM | POA: Diagnosis not present

## 2019-11-29 DIAGNOSIS — C61 Malignant neoplasm of prostate: Secondary | ICD-10-CM

## 2019-11-29 NOTE — Progress Notes (Signed)
Follow up appointment for patient due to Malignant neoplasm of the prostate. Denies any hematuria dysuria denies any urgency frequency with a occasionally leakage. Denies any diarrhea or blood in stool. Follow up appointment with urologist February 13, 2020

## 2019-11-29 NOTE — Progress Notes (Signed)
Radiation Oncology         [phone] ________________________________  Follow Up New - Conducted via telephone due to current COVID-19 concerns for limiting patient exposure  Name: Nathan Schmitt MRN: [id number]  Date: 11/29/2019  DOB: Dec 11, 1950  EU:MPNTI, Cristi Loron, MD  [name], MD   REFERRING PHYSICIAN: [name], MD  DIAGNOSIS: 69 y.o. gentleman with rising PSA of 0.28 s/p RALP in 01/2013 for stage pT2c, pN0 Gleason 3+4 prostate cancer.    ICD-10-CM   1. Prostate cancer ([place])  C61     HISTORY OF PRESENT ILLNESS::Nathan Schmitt is a 69 y.o. gentleman with a history of stage pT2cN0M0 adenocarcinoma of the prostate with a Gleason's score of 3+4 and a pre-treatment PSA of 5.83. At the time of initial diagnosis with TRUSPBx on 12/14/2012, the maximum Gleason score was 3+4.  Out of the 12 biopsies, 4 were positive in the right lateral apex (3+3), right apex (3+4), left lateral medial (3+3), and left lateral apex (3+3). 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  He met with Dr. Valere Dross in consultation to discuss treatment options in 12/2012 but ultimately elected to proceed with BNS RALP and BPLND on 02/14/13, under the care and direction of Dr. Alinda Money.  Pathology revealed negative surgical margins, no ECE, SVI or lymph node involvement.  His initial post-treatment PSA was undetectable but was detectable at 0.03 in 01/2014 and continued to gradually increase to 0.18 in 10/2016 and 0.17 in 02/2017. His case was presented at the multidisciplinary prostate conference on 03/21/2017 and we met with him to discuss treatment recommendations. At that time, he opted for PSA surveillance and has continued to be followed closely with Dr. Alinda Money since.      His PSA remained stable until this year, when it increased above 0.2 as below: 09/2017 - 0.16 05/2018 - 0.17 11/2018 - 0.13 07/2019 -  0.21 10/2019 - 0.28  He was able to regain full continence following surgery and has maintained good urinary control with only minimal SUI on occasion. He denies any new voiding complaints or changes in his overall health.   The patient reviewed the PSA results with his urologist and he has kindly been referred back to [place] today to further discuss the role of salvage radiation treatment.  PREVIOUS RADIATION THERAPY: No  PAST MEDICAL HISTORY:  has a past medical history of Cancer ([place]), Gout, Hypertension, and Prostate cancer ([place]).    PAST SURGICAL HISTORY: Past Surgical History:  Procedure Laterality Date  . FINGER SURGERY  2005   removal cyst 5th finger left hand  . RETINAL DETACHMENT SURGERY  1977   left  . ROBOT ASSISTED LAPAROSCOPIC RADICAL PROSTATECTOMY N/A 02/14/2013   Procedure: ROBOTIC ASSISTED LAPAROSCOPIC RADICAL PROSTATECTOMY  WITH BILATERAL NODE DISSECTIONS;  Surgeon: Dutch Gray, MD;  Location: [facility];  Service: Urology;  Laterality: N/A;    FAMILY HISTORY: family history includes Cancer in his mother and sister; Colon cancer (age of onset: 78) in his maternal uncle; Prostate cancer (age of onset: 9) in his father.  SOCIAL HISTORY:  reports that he has never smoked. He quit smokeless tobacco use about 13 years ago.  His smokeless tobacco use included chew. He reports current alcohol use. He reports that he does not use drugs.  ALLERGIES: Penicillins  MEDICATIONS:  Current Outpatient Medications  Medication Sig Dispense Refill  . allopurinol (ZYLOPRIM) 300 MG tablet Take  300 mg by mouth daily.    Marland Kitchen lisinopril (PRINIVIL,ZESTRIL) 10 MG tablet Take 10 mg by mouth every evening.     . Multiple Vitamin (MULTI-VITAMINS) TABS Take by mouth.    . tadalafil (CIALIS) 5 MG tablet      No current facility-administered medications for this encounter.    REVIEW OF SYSTEMS:  On review of systems, the patient reports that he is doing well overall. He denies any chest pain, shortness  of breath, cough, fevers, chills, night sweats, unintended weight changes. He denies any bowel disturbances, and denies abdominal pain, nausea or vomiting. He denies any new musculoskeletal or joint aches or pains. His IPSS was 1, indicating mild urinary symptoms. He has regained full urinary continence and denies gross hematuria, dysuria, frequency, urgency or excessive nocturia.  He is unable to complete sexual activity with a score of 5 indicating severe ED. A complete review of systems is obtained and is otherwise negative.   PHYSICAL EXAM:  Wt Readings from Last 3 Encounters:  03/21/17 166 lb 9.6 oz (75.6 kg)  02/14/13 172 lb (78 kg)  02/08/13 172 lb (78 kg)   Temp Readings from Last 3 Encounters:  03/21/17 98 F (36.7 C) (Oral)  02/15/13 97.8 F (36.6 C) (Oral)  02/08/13 98.2 F (36.8 C) (Oral)   BP Readings from Last 3 Encounters:  03/21/17 (!) 139/93  02/15/13 104/63  02/08/13 (!) 145/88   Pulse Readings from Last 3 Encounters:  03/21/17 63  02/15/13 66  02/08/13 63    /10  Physical exam not performed in light of telephone re-consult visit format.  KPS = 100  100 - Normal; no complaints; no evidence of disease. 90   - Able to carry on normal activity; minor signs or symptoms of disease. 80   - Normal activity with effort; some signs or symptoms of disease. 65   - Cares for self; unable to carry on normal activity or to do active work. 60   - Requires occasional assistance, but is able to care for most of his personal needs. 50   - Requires considerable assistance and frequent medical care. 77   - Disabled; requires special care and assistance. 107   - Severely disabled; hospital admission is indicated although death not imminent. 71   - Very sick; hospital admission necessary; active supportive treatment necessary. 10   - Moribund; fatal processes progressing rapidly. 0     - Dead  Karnofsky DA, Abelmann [place], Craver LS and Burchenal [facility] [phone]) The use of the nitrogen  mustards in the palliative treatment of carcinoma: with particular reference to bronchogenic carcinoma Cancer 1 [phone]   LABORATORY DATA:  Lab Results  Component Value Date   WBC 4.8 02/08/2013   HGB 10.9 (L) 02/15/2013   HCT 34.8 (L) 02/15/2013   MCV 88.3 02/08/2013   PLT 233 02/08/2013   Lab Results  Component Value Date   NA 139 02/08/2013   K 4.3 02/08/2013   CL 102 02/08/2013   CO2 30 02/08/2013   No results found for: ALT, AST, GGT, ALKPHOS, BILITOT   RADIOGRAPHY: No results found.    IMPRESSION/PLAN: This visit was conducted via telephone to spare the patient unnecessary potential exposure in the healthcare setting during the current COVID-19 pandemic. 1. 69 y.o. gentleman with rising PSA of 0.28 s/p RALP in 01/2013 for stage pT2c, pN0 Gleason 3+4 prostate cancer. Today I reviewed the findings and workup thus far.  We discussed the natural history of prostate  cancer.  We reviewed the  implications of detectable rising post-op PSA.   We discussed radiation treatment directed to the prostatic fossa with regard to the logistics and delivery of external beam radiation treatment.  We discussed the addition of ADT to radiotherapy based on prospective clinical trials as well as the [place] secondary analysis showing less benefit in the setting of recurrent PSA less than 0.5.  We discussed the potential effects of ADT now with radiation versus later for salvage.  He was encouraged to ask questions that were answered to his stated satisfaction.  At the end of our conversation, the patient would like to proceed with prostate fossa radiotherapy without ADT.  He has provided verbal consent to proceed so we have tentatively scheduled CT sim on Tuesday 12/17/19 at 8:30 am.  He will sign formal written consent to proceed at that the time of his treatment planning and a copy of this document will be placed in his medical record.  He appears to have a good understanding of his disease and our  recommendations which are of curative intent and hs is comfortable and in agreement with the stated plan.  We will share our discussion with Dr. Alinda Money and move forward with treatment planning accordingly in anticipation of beginning his daily treatments in the near future.   Given current concerns for patient exposure during the COVID-19 pandemic, this encounter was conducted via telephone. The patient was notified in advance and was offered a [place] meeting to allow for face to face communication but unfortunately reported that he did not have the appropriate resources/technology to support such a visit and instead preferred to proceed with telephone consult. The patient has given verbal consent for this type of encounter. The time spent during this encounter was 30 minutes. The attendants for this meeting include Tyler Pita MD, Ashlyn Bruning PA-C, [place], patient, Nathan Schmitt. During the encounter, Tyler Pita MD, Ashlyn Bruning PA-C, and scribe, Wilburn Mylar were located at [place].  Patient, Nathan Schmitt was located at home.    Nicholos Johns, PA-C    Tyler Pita, MD  [facility] Direct Dial: [phone]  Fax: [phone] [email or web address]  Skype  [email or web address]  This document serves as a record of services personally performed by Tyler Pita, MD and Freeman Caldron, PA-C. It was created on their behalf by Wilburn Mylar, a trained medical scribe. The creation of this record is based on the scribe's personal observations and the provider's statements to them. This document has been checked and approved by the attending provider.

## 2019-12-17 ENCOUNTER — Ambulatory Visit
Admission: RE | Admit: 2019-12-17 | Discharge: 2019-12-17 | Disposition: A | Discharge status: Home / Self Care | Payer: PPO | Source: Ambulatory Visit | Attending: Radiation Oncology | Admitting: Radiation Oncology

## 2019-12-17 ENCOUNTER — Encounter: Payer: Self-pay | Admitting: Medical Oncology

## 2019-12-17 ENCOUNTER — Other Ambulatory Visit: Payer: Self-pay

## 2019-12-17 DIAGNOSIS — C61 Malignant neoplasm of prostate: Secondary | ICD-10-CM | POA: Diagnosis not present

## 2019-12-19 NOTE — Progress Notes (Signed)
  Radiation Oncology         [phone] ________________________________  Name: Nathan Schmitt MRN: [id number]  Date: 12/17/2019  DOB: Dec 15, 1950  SIMULATION AND TREATMENT PLANNING NOTE    ICD-10-CM   1. Prostate cancer [facility])  C61     DIAGNOSIS:   70 y.o. gentleman with rising PSA of 0.28 s/p RALP in 01/2013 for stage pT2c, pN0 Gleason 3+4 prostate cancer.  NARRATIVE:  The patient was brought to the [place].  Identity was confirmed.  All relevant records and images related to the planned course of therapy were reviewed.  The patient freely provided informed written consent to proceed with treatment after reviewing the details related to the planned course of therapy. The consent form was witnessed and verified by the simulation staff.  Then, the patient was set-up in a stable reproducible supine position for radiation therapy.  A vacuum lock pillow device was custom fabricated to position his legs in a reproducible immobilized position.  Then, I performed a urethrogram under sterile conditions to identify the prostatic apex.  CT images were obtained.  Surface markings were placed.  The CT images were loaded into the planning software.  Then the prostate target and avoidance structures including the rectum, bladder, bowel and hips were contoured.  Treatment planning then occurred.  The radiation prescription was entered and confirmed.  A total of one complex treatment devices was fabricated. I have requested : Intensity Modulated Radiotherapy (IMRT) is medically necessary for this case for the following reason:  Rectal sparing.Marland Kitchen  PLAN:  The patient will receive 68.4 Gy in 38 fractions.  ________________________________  Sheral Apley Tammi Klippel, M.D.

## 2019-12-26 ENCOUNTER — Encounter: Payer: Self-pay | Admitting: Medical Oncology

## 2019-12-26 ENCOUNTER — Ambulatory Visit
Admission: RE | Admit: 2019-12-26 | Discharge: 2019-12-26 | Disposition: A | Discharge status: Home / Self Care | Payer: PPO | Source: Ambulatory Visit | Attending: Radiation Oncology | Admitting: Radiation Oncology

## 2019-12-26 ENCOUNTER — Other Ambulatory Visit: Payer: Self-pay

## 2019-12-26 DIAGNOSIS — C61 Malignant neoplasm of prostate: Secondary | ICD-10-CM | POA: Insufficient documentation

## 2019-12-27 ENCOUNTER — Other Ambulatory Visit: Payer: Self-pay

## 2019-12-27 ENCOUNTER — Ambulatory Visit
Admission: RE | Admit: 2019-12-27 | Discharge: 2019-12-27 | Disposition: A | Discharge status: Home / Self Care | Payer: PPO | Source: Ambulatory Visit | Attending: Radiation Oncology | Admitting: Radiation Oncology

## 2019-12-27 DIAGNOSIS — C61 Malignant neoplasm of prostate: Secondary | ICD-10-CM | POA: Diagnosis not present

## 2019-12-30 ENCOUNTER — Ambulatory Visit
Admission: RE | Admit: 2019-12-30 | Discharge: 2019-12-30 | Disposition: A | Discharge status: Home / Self Care | Payer: PPO | Source: Ambulatory Visit | Attending: Radiation Oncology | Admitting: Radiation Oncology

## 2019-12-30 ENCOUNTER — Other Ambulatory Visit: Payer: Self-pay

## 2019-12-30 DIAGNOSIS — C61 Malignant neoplasm of prostate: Secondary | ICD-10-CM | POA: Diagnosis not present

## 2019-12-31 ENCOUNTER — Ambulatory Visit
Admission: RE | Admit: 2019-12-31 | Discharge: 2019-12-31 | Disposition: A | Discharge status: Home / Self Care | Payer: PPO | Source: Ambulatory Visit | Attending: Radiation Oncology | Admitting: Radiation Oncology

## 2019-12-31 ENCOUNTER — Other Ambulatory Visit: Payer: Self-pay

## 2019-12-31 DIAGNOSIS — C61 Malignant neoplasm of prostate: Secondary | ICD-10-CM | POA: Diagnosis not present

## 2020-01-01 ENCOUNTER — Other Ambulatory Visit: Payer: Self-pay

## 2020-01-01 ENCOUNTER — Ambulatory Visit
Admission: RE | Admit: 2020-01-01 | Discharge: 2020-01-01 | Disposition: A | Discharge status: Home / Self Care | Payer: PPO | Source: Ambulatory Visit | Attending: Radiation Oncology | Admitting: Radiation Oncology

## 2020-01-01 DIAGNOSIS — C61 Malignant neoplasm of prostate: Secondary | ICD-10-CM | POA: Diagnosis not present

## 2020-01-02 ENCOUNTER — Ambulatory Visit
Admission: RE | Admit: 2020-01-02 | Discharge: 2020-01-02 | Disposition: A | Discharge status: Home / Self Care | Payer: PPO | Source: Ambulatory Visit | Attending: Radiation Oncology | Admitting: Radiation Oncology

## 2020-01-02 ENCOUNTER — Other Ambulatory Visit: Payer: Self-pay

## 2020-01-02 DIAGNOSIS — C61 Malignant neoplasm of prostate: Secondary | ICD-10-CM | POA: Diagnosis not present

## 2020-01-03 ENCOUNTER — Ambulatory Visit
Admission: RE | Admit: 2020-01-03 | Discharge: 2020-01-03 | Disposition: A | Discharge status: Home / Self Care | Payer: PPO | Source: Ambulatory Visit | Attending: Radiation Oncology | Admitting: Radiation Oncology

## 2020-01-03 ENCOUNTER — Other Ambulatory Visit: Payer: Self-pay

## 2020-01-03 DIAGNOSIS — C61 Malignant neoplasm of prostate: Secondary | ICD-10-CM | POA: Diagnosis not present

## 2020-01-03 NOTE — Progress Notes (Signed)
Pt here for patient teaching.  Pt given Radiation and You booklet and skin care instructions.  Reviewed areas of pertinence such as diarrhea, fatigue, hair loss, sexual and fertility changes and urinary and bladder changes . Pt able to give teach back of to pat skin, use unscented/gentle soap, use baby wipes, have Imodium on hand, drink plenty of water and sitz bath,. Pt demonstrated understanding of information given and will contact nursing with any questions or concerns.     Http://rtanswers.org/treatmentinformation/whattoexpect/index

## 2020-01-06 ENCOUNTER — Ambulatory Visit
Admission: RE | Admit: 2020-01-06 | Discharge: 2020-01-06 | Disposition: A | Discharge status: Home / Self Care | Payer: PPO | Source: Ambulatory Visit | Attending: Radiation Oncology | Admitting: Radiation Oncology

## 2020-01-06 DIAGNOSIS — C61 Malignant neoplasm of prostate: Secondary | ICD-10-CM | POA: Diagnosis not present

## 2020-01-07 ENCOUNTER — Other Ambulatory Visit: Payer: Self-pay

## 2020-01-07 ENCOUNTER — Ambulatory Visit
Admission: RE | Admit: 2020-01-07 | Discharge: 2020-01-07 | Disposition: A | Discharge status: Home / Self Care | Payer: PPO | Source: Ambulatory Visit | Attending: Radiation Oncology | Admitting: Radiation Oncology

## 2020-01-07 DIAGNOSIS — C61 Malignant neoplasm of prostate: Secondary | ICD-10-CM | POA: Diagnosis not present

## 2020-01-08 ENCOUNTER — Other Ambulatory Visit: Payer: Self-pay

## 2020-01-08 ENCOUNTER — Ambulatory Visit
Admission: RE | Admit: 2020-01-08 | Discharge: 2020-01-08 | Disposition: A | Discharge status: Home / Self Care | Payer: PPO | Source: Ambulatory Visit | Attending: Radiation Oncology | Admitting: Radiation Oncology

## 2020-01-08 DIAGNOSIS — C61 Malignant neoplasm of prostate: Secondary | ICD-10-CM | POA: Diagnosis not present

## 2020-01-09 ENCOUNTER — Ambulatory Visit
Admission: RE | Admit: 2020-01-09 | Discharge: 2020-01-09 | Disposition: A | Discharge status: Home / Self Care | Payer: PPO | Source: Ambulatory Visit | Attending: Radiation Oncology | Admitting: Radiation Oncology

## 2020-01-09 ENCOUNTER — Other Ambulatory Visit: Payer: Self-pay

## 2020-01-09 DIAGNOSIS — C61 Malignant neoplasm of prostate: Secondary | ICD-10-CM | POA: Diagnosis not present

## 2020-01-09 MED FILL — LISINOPRIL 10 MG TABS: 10 | 90 days supply | Qty: 90 | Fill #1

## 2020-01-09 MED FILL — ALLOPURINOL 300 MG TABLET: 300 | 90 days supply | Qty: 90 | Fill #1

## 2020-01-10 ENCOUNTER — Ambulatory Visit
Admission: RE | Admit: 2020-01-10 | Discharge: 2020-01-10 | Disposition: A | Discharge status: Home / Self Care | Payer: PPO | Source: Ambulatory Visit | Attending: Radiation Oncology | Admitting: Radiation Oncology

## 2020-01-10 ENCOUNTER — Other Ambulatory Visit: Payer: Self-pay

## 2020-01-10 DIAGNOSIS — C61 Malignant neoplasm of prostate: Secondary | ICD-10-CM | POA: Diagnosis not present

## 2020-01-13 ENCOUNTER — Other Ambulatory Visit: Payer: Self-pay

## 2020-01-13 ENCOUNTER — Ambulatory Visit
Admission: RE | Admit: 2020-01-13 | Discharge: 2020-01-13 | Disposition: A | Discharge status: Home / Self Care | Payer: PPO | Source: Ambulatory Visit | Attending: Radiation Oncology | Admitting: Radiation Oncology

## 2020-01-13 DIAGNOSIS — C61 Malignant neoplasm of prostate: Secondary | ICD-10-CM | POA: Diagnosis not present

## 2020-01-14 ENCOUNTER — Other Ambulatory Visit: Payer: Self-pay

## 2020-01-14 ENCOUNTER — Ambulatory Visit
Admission: RE | Admit: 2020-01-14 | Discharge: 2020-01-14 | Disposition: A | Discharge status: Home / Self Care | Payer: PPO | Source: Ambulatory Visit | Attending: Radiation Oncology | Admitting: Radiation Oncology

## 2020-01-14 DIAGNOSIS — C61 Malignant neoplasm of prostate: Secondary | ICD-10-CM | POA: Diagnosis not present

## 2020-01-15 ENCOUNTER — Ambulatory Visit
Admission: RE | Admit: 2020-01-15 | Discharge: 2020-01-15 | Disposition: A | Discharge status: Home / Self Care | Payer: PPO | Source: Ambulatory Visit | Attending: Radiation Oncology | Admitting: Radiation Oncology

## 2020-01-15 ENCOUNTER — Other Ambulatory Visit: Payer: Self-pay

## 2020-01-15 DIAGNOSIS — C61 Malignant neoplasm of prostate: Secondary | ICD-10-CM | POA: Diagnosis not present

## 2020-01-16 ENCOUNTER — Other Ambulatory Visit: Payer: Self-pay

## 2020-01-16 ENCOUNTER — Ambulatory Visit
Admission: RE | Admit: 2020-01-16 | Discharge: 2020-01-16 | Disposition: A | Discharge status: Home / Self Care | Payer: PPO | Source: Ambulatory Visit | Attending: Radiation Oncology | Admitting: Radiation Oncology

## 2020-01-16 DIAGNOSIS — C61 Malignant neoplasm of prostate: Secondary | ICD-10-CM | POA: Diagnosis not present

## 2020-01-17 ENCOUNTER — Other Ambulatory Visit: Payer: Self-pay

## 2020-01-17 ENCOUNTER — Ambulatory Visit
Admission: RE | Admit: 2020-01-17 | Discharge: 2020-01-17 | Disposition: A | Discharge status: Home / Self Care | Payer: PPO | Source: Ambulatory Visit | Attending: Radiation Oncology | Admitting: Radiation Oncology

## 2020-01-17 DIAGNOSIS — C61 Malignant neoplasm of prostate: Secondary | ICD-10-CM | POA: Diagnosis not present

## 2020-01-20 ENCOUNTER — Ambulatory Visit
Admission: RE | Admit: 2020-01-20 | Discharge: 2020-01-20 | Disposition: A | Discharge status: Home / Self Care | Payer: PPO | Source: Ambulatory Visit | Attending: Radiation Oncology | Admitting: Radiation Oncology

## 2020-01-20 ENCOUNTER — Other Ambulatory Visit: Payer: Self-pay

## 2020-01-20 DIAGNOSIS — C61 Malignant neoplasm of prostate: Secondary | ICD-10-CM | POA: Diagnosis not present

## 2020-01-21 ENCOUNTER — Other Ambulatory Visit: Payer: Self-pay

## 2020-01-21 ENCOUNTER — Ambulatory Visit
Admission: RE | Admit: 2020-01-21 | Discharge: 2020-01-21 | Disposition: A | Discharge status: Home / Self Care | Payer: PPO | Source: Ambulatory Visit | Attending: Radiation Oncology | Admitting: Radiation Oncology

## 2020-01-21 DIAGNOSIS — C61 Malignant neoplasm of prostate: Secondary | ICD-10-CM | POA: Diagnosis not present

## 2020-01-22 ENCOUNTER — Other Ambulatory Visit: Payer: Self-pay

## 2020-01-22 ENCOUNTER — Ambulatory Visit
Admission: RE | Admit: 2020-01-22 | Discharge: 2020-01-22 | Disposition: A | Discharge status: Home / Self Care | Payer: PPO | Source: Ambulatory Visit | Attending: Radiation Oncology | Admitting: Radiation Oncology

## 2020-01-22 DIAGNOSIS — C61 Malignant neoplasm of prostate: Secondary | ICD-10-CM | POA: Diagnosis not present

## 2020-01-23 ENCOUNTER — Other Ambulatory Visit: Payer: Self-pay

## 2020-01-23 ENCOUNTER — Ambulatory Visit
Admission: RE | Admit: 2020-01-23 | Discharge: 2020-01-23 | Disposition: A | Discharge status: Home / Self Care | Payer: PPO | Source: Ambulatory Visit | Attending: Radiation Oncology | Admitting: Radiation Oncology

## 2020-01-23 DIAGNOSIS — C61 Malignant neoplasm of prostate: Secondary | ICD-10-CM | POA: Diagnosis not present

## 2020-01-24 ENCOUNTER — Ambulatory Visit
Admission: RE | Admit: 2020-01-24 | Discharge: 2020-01-24 | Disposition: A | Discharge status: Home / Self Care | Payer: PPO | Source: Ambulatory Visit | Attending: Radiation Oncology | Admitting: Radiation Oncology

## 2020-01-24 ENCOUNTER — Other Ambulatory Visit: Payer: Self-pay

## 2020-01-24 DIAGNOSIS — C61 Malignant neoplasm of prostate: Secondary | ICD-10-CM | POA: Diagnosis not present

## 2020-01-28 ENCOUNTER — Ambulatory Visit
Admission: RE | Admit: 2020-01-28 | Discharge: 2020-01-28 | Disposition: A | Discharge status: Home / Self Care | Payer: PPO | Source: Ambulatory Visit | Attending: Radiation Oncology | Admitting: Radiation Oncology

## 2020-01-28 DIAGNOSIS — C61 Malignant neoplasm of prostate: Secondary | ICD-10-CM | POA: Diagnosis not present

## 2020-01-29 ENCOUNTER — Ambulatory Visit
Admission: RE | Admit: 2020-01-29 | Discharge: 2020-01-29 | Disposition: A | Discharge status: Home / Self Care | Payer: PPO | Source: Ambulatory Visit | Attending: Radiation Oncology | Admitting: Radiation Oncology

## 2020-01-29 DIAGNOSIS — C61 Malignant neoplasm of prostate: Secondary | ICD-10-CM | POA: Diagnosis not present

## 2020-01-30 ENCOUNTER — Ambulatory Visit
Admission: RE | Admit: 2020-01-30 | Discharge: 2020-01-30 | Disposition: A | Discharge status: Home / Self Care | Payer: PPO | Source: Ambulatory Visit | Attending: Radiation Oncology | Admitting: Radiation Oncology

## 2020-01-30 ENCOUNTER — Other Ambulatory Visit: Payer: Self-pay

## 2020-01-30 DIAGNOSIS — C61 Malignant neoplasm of prostate: Secondary | ICD-10-CM | POA: Diagnosis not present

## 2020-01-31 ENCOUNTER — Other Ambulatory Visit: Payer: Self-pay

## 2020-01-31 ENCOUNTER — Ambulatory Visit
Admission: RE | Admit: 2020-01-31 | Discharge: 2020-01-31 | Disposition: A | Discharge status: Home / Self Care | Payer: PPO | Source: Ambulatory Visit | Attending: Radiation Oncology | Admitting: Radiation Oncology

## 2020-01-31 DIAGNOSIS — C61 Malignant neoplasm of prostate: Secondary | ICD-10-CM | POA: Diagnosis not present

## 2020-02-03 ENCOUNTER — Other Ambulatory Visit: Payer: Self-pay

## 2020-02-03 ENCOUNTER — Ambulatory Visit
Admission: RE | Admit: 2020-02-03 | Discharge: 2020-02-03 | Disposition: A | Discharge status: Home / Self Care | Payer: PPO | Source: Ambulatory Visit | Attending: Radiation Oncology | Admitting: Radiation Oncology

## 2020-02-03 DIAGNOSIS — C61 Malignant neoplasm of prostate: Secondary | ICD-10-CM | POA: Diagnosis not present

## 2020-02-04 ENCOUNTER — Other Ambulatory Visit: Payer: Self-pay

## 2020-02-04 ENCOUNTER — Ambulatory Visit
Admission: RE | Admit: 2020-02-04 | Discharge: 2020-02-04 | Disposition: A | Discharge status: Home / Self Care | Payer: PPO | Source: Ambulatory Visit | Attending: Radiation Oncology | Admitting: Radiation Oncology

## 2020-02-04 DIAGNOSIS — C61 Malignant neoplasm of prostate: Secondary | ICD-10-CM | POA: Diagnosis not present

## 2020-02-05 ENCOUNTER — Other Ambulatory Visit: Payer: Self-pay

## 2020-02-05 ENCOUNTER — Ambulatory Visit
Admission: RE | Admit: 2020-02-05 | Discharge: 2020-02-05 | Disposition: A | Discharge status: Home / Self Care | Payer: PPO | Source: Ambulatory Visit | Attending: Radiation Oncology | Admitting: Radiation Oncology

## 2020-02-05 DIAGNOSIS — C61 Malignant neoplasm of prostate: Secondary | ICD-10-CM | POA: Diagnosis not present

## 2020-02-06 ENCOUNTER — Ambulatory Visit
Admission: RE | Admit: 2020-02-06 | Discharge: 2020-02-06 | Disposition: A | Discharge status: Home / Self Care | Payer: PPO | Source: Ambulatory Visit | Attending: Radiation Oncology | Admitting: Radiation Oncology

## 2020-02-06 ENCOUNTER — Other Ambulatory Visit: Payer: Self-pay

## 2020-02-06 DIAGNOSIS — C61 Malignant neoplasm of prostate: Secondary | ICD-10-CM | POA: Diagnosis not present

## 2020-02-07 ENCOUNTER — Ambulatory Visit
Admission: RE | Admit: 2020-02-07 | Discharge: 2020-02-07 | Disposition: A | Discharge status: Home / Self Care | Payer: PPO | Source: Ambulatory Visit | Attending: Radiation Oncology | Admitting: Radiation Oncology

## 2020-02-07 ENCOUNTER — Other Ambulatory Visit: Payer: Self-pay

## 2020-02-07 DIAGNOSIS — C61 Malignant neoplasm of prostate: Secondary | ICD-10-CM | POA: Diagnosis not present

## 2020-02-10 ENCOUNTER — Other Ambulatory Visit: Payer: Self-pay

## 2020-02-10 ENCOUNTER — Ambulatory Visit
Admission: RE | Admit: 2020-02-10 | Discharge: 2020-02-10 | Disposition: A | Discharge status: Home / Self Care | Payer: PPO | Source: Ambulatory Visit | Attending: Radiation Oncology | Admitting: Radiation Oncology

## 2020-02-10 DIAGNOSIS — C61 Malignant neoplasm of prostate: Secondary | ICD-10-CM | POA: Diagnosis not present

## 2020-02-11 ENCOUNTER — Other Ambulatory Visit: Payer: Self-pay

## 2020-02-11 ENCOUNTER — Ambulatory Visit
Admission: RE | Admit: 2020-02-11 | Discharge: 2020-02-11 | Disposition: A | Discharge status: Home / Self Care | Payer: PPO | Source: Ambulatory Visit | Attending: Radiation Oncology | Admitting: Radiation Oncology

## 2020-02-11 DIAGNOSIS — C61 Malignant neoplasm of prostate: Secondary | ICD-10-CM | POA: Diagnosis not present

## 2020-02-12 ENCOUNTER — Other Ambulatory Visit: Payer: Self-pay

## 2020-02-12 ENCOUNTER — Ambulatory Visit
Admission: RE | Admit: 2020-02-12 | Discharge: 2020-02-12 | Disposition: A | Discharge status: Home / Self Care | Payer: PPO | Source: Ambulatory Visit | Attending: Radiation Oncology | Admitting: Radiation Oncology

## 2020-02-12 DIAGNOSIS — C61 Malignant neoplasm of prostate: Secondary | ICD-10-CM | POA: Diagnosis not present

## 2020-02-13 ENCOUNTER — Other Ambulatory Visit: Payer: Self-pay

## 2020-02-13 ENCOUNTER — Ambulatory Visit
Admission: RE | Admit: 2020-02-13 | Discharge: 2020-02-13 | Disposition: A | Discharge status: Home / Self Care | Payer: PPO | Source: Ambulatory Visit | Attending: Radiation Oncology | Admitting: Radiation Oncology

## 2020-02-13 DIAGNOSIS — C61 Malignant neoplasm of prostate: Secondary | ICD-10-CM | POA: Diagnosis not present

## 2020-02-14 ENCOUNTER — Ambulatory Visit
Admission: RE | Admit: 2020-02-14 | Discharge: 2020-02-14 | Disposition: A | Discharge status: Home / Self Care | Payer: PPO | Source: Ambulatory Visit | Attending: Radiation Oncology | Admitting: Radiation Oncology

## 2020-02-14 DIAGNOSIS — C61 Malignant neoplasm of prostate: Secondary | ICD-10-CM | POA: Diagnosis not present

## 2020-02-17 ENCOUNTER — Other Ambulatory Visit: Payer: Self-pay

## 2020-02-17 ENCOUNTER — Ambulatory Visit
Admission: RE | Admit: 2020-02-17 | Discharge: 2020-02-17 | Disposition: A | Discharge status: Home / Self Care | Payer: PPO | Source: Ambulatory Visit | Attending: Radiation Oncology | Admitting: Radiation Oncology

## 2020-02-17 DIAGNOSIS — C61 Malignant neoplasm of prostate: Secondary | ICD-10-CM | POA: Diagnosis not present

## 2020-02-18 ENCOUNTER — Other Ambulatory Visit: Payer: Self-pay

## 2020-02-18 ENCOUNTER — Encounter: Payer: Self-pay | Admitting: Medical Oncology

## 2020-02-18 ENCOUNTER — Encounter: Payer: Self-pay | Admitting: Radiation Oncology

## 2020-02-18 ENCOUNTER — Ambulatory Visit
Admission: RE | Admit: 2020-02-18 | Discharge: 2020-02-18 | Disposition: A | Discharge status: Home / Self Care | Payer: PPO | Source: Ambulatory Visit | Attending: Radiation Oncology | Admitting: Radiation Oncology

## 2020-02-18 DIAGNOSIS — C61 Malignant neoplasm of prostate: Secondary | ICD-10-CM | POA: Diagnosis not present

## 2020-03-17 ENCOUNTER — Telehealth: Payer: Self-pay

## 2020-03-17 ENCOUNTER — Other Ambulatory Visit: Payer: Self-pay

## 2020-03-17 ENCOUNTER — Encounter: Payer: Self-pay | Admitting: Urology

## 2020-03-17 NOTE — Telephone Encounter (Signed)
Spoke with patient in regards to telephone visit with Freeman Caldron PA on 03/17/20 at 1:30pm. Patient verbalized understanding of appointment date and time. Meaningful use , AUA and prostate questions were reviewed. TM

## 2020-03-17 NOTE — Progress Notes (Signed)
Patient has a telephone visit with Freeman Caldron PA Patient denies having nocturia or hematuria. Patient states regular bowel movements. Patient states urine stream is strong and steady. Patient states that he is emptying his bladder completely. Patient denies having urgency. Patient denies hesitancy or straining. Patient states occasional leakage when he sneezes or coughs. Patient denies fatigue. Patient states that he has a follow-up appointment with [facility] in January 2022.

## 2020-03-19 ENCOUNTER — Other Ambulatory Visit: Payer: Self-pay

## 2020-03-19 ENCOUNTER — Ambulatory Visit
Admission: RE | Admit: 2020-03-19 | Discharge: 2020-03-19 | Disposition: A | Discharge status: Home / Self Care | Payer: PPO | Source: Ambulatory Visit | Attending: Urology | Admitting: Urology

## 2020-03-19 DIAGNOSIS — C61 Malignant neoplasm of prostate: Secondary | ICD-10-CM

## 2020-03-19 NOTE — Progress Notes (Signed)
Radiation Oncology         [phone] ________________________________  Name: Nathan Schmitt MRN: [id number]  Date: 03/19/2020  DOB: 08-Oct-1950  Post Treatment Note  CC: Nathan Dales, MD  Nathan Bring, MD  Diagnosis:   69 y.o.gentleman with rising PSA of 0.28 s/p RALP in 01/2013 forstage pT2c, pN0Gleason 3+4 prostate cancer.   Interval Since Last Radiation:  4 weeks  12/26/19 - 02/18/20: 1. The prostate fossa and pelvic lymph nodes were initially treated to 45 Gy in 25 fractions of 1.8 Gy  2. The prostate fossa only was boosted to 68.4 Gy with 13 additional fractions of 1.8 Gy   Narrative:  I spoke with the patient to conduct his routine scheduled 1 month follow up visit via telephone to spare the patient unnecessary potential exposure in the healthcare setting during the current COVID-19 pandemic.  The patient was notified in advance and gave permission to proceed with this visit format. He tolerated radiation treatment relatively well with only minor urinary irritation and modest fatigue. He denied dysuria, nocturia or gross hematuria and maintained a strong and steady urine stream. He reported regular bowel movements. He reported emptying his bladder completely and denied urgency, hesitancy or straining. He reported occasional leakage.                               On review of systems, the patient states that he is doing very well in general. He denies any residual LUTS and reports that his bowels have remained regular. He specifically denies dysuria, gross hematuria, excessive daytime frequency, urgency, intermittency, hesitancy, incomplete bladder emptying or incontinence. He reports a healthy appetite and is maintaining his weight. He denies abdominal pain, nausea, vomiting, diarrhea or constipation. Overall, he is quite pleased with his progress to date.  ALLERGIES:  is allergic to penicillins.  Meds: Current Outpatient Medications  Medication Sig Dispense Refill  .  allopurinol (ZYLOPRIM) 300 MG tablet Take 300 mg by mouth daily.    Marland Kitchen lisinopril (PRINIVIL,ZESTRIL) 10 MG tablet Take 10 mg by mouth every evening.     . Multiple Vitamin (MULTI-VITAMINS) TABS Take by mouth.    . tadalafil (CIALIS) 5 MG tablet      No current facility-administered medications for this encounter.    Physical Findings:  vitals were not taken for this visit.   /Unable to assess due to telephone follow up visit format.  Lab Findings: Lab Results  Component Value Date   WBC 4.8 02/08/2013   HGB 10.9 (L) 02/15/2013   HCT 34.8 (L) 02/15/2013   MCV 88.3 02/08/2013   PLT 233 02/08/2013     Radiographic Findings: No results found.  Impression/Plan: 1. 69 y.o.gentleman with rising PSA of 0.28 s/p RALP in 01/2013 forstage pT2c, pN0Gleason 3+4 prostate cancer.    He will continue to follow up with urology for ongoing PSA determinations and has an appointment scheduled with Dr. Alinda Schmitt on 08/21/2020. He understands what to expect with regards to PSA monitoring going forward. I will look forward to following his response to treatment via correspondence with urology, and would be happy to continue to participate in his care if clinically indicated. I talked to the patient about what to expect in the future, including his risk for erectile dysfunction and rectal bleeding. I encouraged him to call or return to the office if he has any questions regarding his previous radiation or possible radiation side effects. He was  comfortable with this plan and will follow up as needed.  Today, a comprehensive survivorship care plan and treatment summary was reviewed with the patient today detailing his prostate cancer diagnosis, treatment course, potential late/long-term effects of treatment, appropriate follow-up care with recommendations for the future, and patient education resources.  A copy of this summary, along with a letter will be sent to the patient's primary care provider via fax after  today's visit.  2. Cancer screening:  Due to Nathan Schmitt history and his age, he should receive screening for skin cancers and colon cancer.  The information and recommendations are listed on the patient's comprehensive care plan/treatment summary and were reviewed in detail with the patient.     3. Health maintenance and wellness promotion: Nathan Schmitt was encouraged to consume 5-7 servings of fruits and vegetables per day. He was provided a copy of the "Nutrition Rainbow" handout, as well as the handout "Take Control of Your Health and [place]" from the [place].  He was also encouraged to engage in moderate to vigorous exercise for 30 minutes per day most days of the week. Information was provided regarding the [facility] fitness program, which is designed for cancer survivors to help them become more physically fit after cancer treatments. We discussed that a healthy BMI is 18.5-24.9 and that maintaining a healthy weight reduces risk of cancer recurrences.  He was instructed to limit his alcohol consumption and continue to abstain from tobacco use.  Lastly, he was encouraged to use sunscreen and wear protective clothing when in the sun.     4. Support services/counseling: It is not uncommon for this period of the patient's cancer care trajectory to be one of many emotions and stressors.  Nathan Schmitt was encouraged to take advantage of our many support services programs, support groups, and/or counseling in coping with his new life as a cancer survivor after completing anti-cancer treatment.  He was offered support today through active listening and expressive supportive counseling.  He was given information regarding our available services and encouraged to contact me with any questions or for help enrolling in any of our support group/programs.       Nathan Johns, PA-C

## 2020-03-19 NOTE — Progress Notes (Addendum)
  Radiation Oncology         [phone]) [phone] ________________________________  Name: Nathan Schmitt MRN: [id number]  Date: 02/18/2020  DOB: 01-04-1951  End of Treatment Note  Diagnosis:   69 y.o. gentleman with rising PSA of 0.28 s/p RALP in 01/2013 for stage pT2c, pN0 Gleason 3+4 prostate cancer.     Indication for treatment:  Curative, Definitive Radiotherapy       Radiation treatment dates:   12/26/19 - 02/18/20  Site/dose:  1. The prostate fossa was treated to 68.4 Gy with 38 additional fractions of 1.8 Gy   Beams/energy:  1. The prostate fossa were treated using VMAT intensity modulated radiotherapy delivering 6 megavolt photons. Image guidance was performed with CB-CT studies prior to each fraction. He was immobilized with a body fix lower extremity mold.    Narrative: The patient tolerated radiation treatment relatively well with only minor urinary irritation and modest fatigue.  He denied dysuria, nocturia or gross hematuria and maintained a strong and steady urine stream. He reported regular bowel movements. He reported emptying his bladder completely and denied urgency, hesitancy or straining. He reported occasional leakage.   Plan: The patient has completed radiation treatment. He will return to radiation oncology clinic for routine followup in one month. I advised him to call or return sooner if he has any questions or concerns related to his recovery or treatment. ________________________________  Sheral Apley. Tammi Klippel, M.D.

## 2020-04-14 ENCOUNTER — Other Ambulatory Visit (HOSPITAL_BASED_OUTPATIENT_CLINIC_OR_DEPARTMENT_OTHER): Payer: Self-pay | Admitting: Internal Medicine

## 2020-04-14 MED FILL — LISINOPRIL 10 MG TABS: 10 | 90 days supply | Qty: 90 | Fill #0

## 2020-04-14 MED FILL — ALLOPURINOL 300 MG TABLET: 300 | 90 days supply | Qty: 90 | Fill #0

## 2020-04-28 DIAGNOSIS — Z8546 Personal history of malignant neoplasm of prostate: Secondary | ICD-10-CM | POA: Diagnosis not present

## 2020-04-28 DIAGNOSIS — M1A9XX0 Chronic gout, unspecified, without tophus (tophi): Secondary | ICD-10-CM | POA: Diagnosis not present

## 2020-04-28 DIAGNOSIS — I10 Essential (primary) hypertension: Secondary | ICD-10-CM | POA: Diagnosis not present

## 2020-04-28 DIAGNOSIS — D509 Iron deficiency anemia, unspecified: Secondary | ICD-10-CM | POA: Diagnosis not present

## 2020-04-28 DIAGNOSIS — Z9079 Acquired absence of other genital organ(s): Secondary | ICD-10-CM | POA: Diagnosis not present

## 2020-07-09 MED FILL — ALLOPURINOL 300 MG TABLET: 300 | 90 days supply | Qty: 90 | Fill #1

## 2020-07-09 MED FILL — LISINOPRIL 10 MG TABS: 10 | 90 days supply | Qty: 90 | Fill #1

## 2020-07-10 ENCOUNTER — Ambulatory Visit: Payer: PPO | Attending: Internal Medicine

## 2020-07-10 ENCOUNTER — Other Ambulatory Visit (HOSPITAL_BASED_OUTPATIENT_CLINIC_OR_DEPARTMENT_OTHER): Payer: Self-pay | Admitting: Internal Medicine

## 2020-07-10 DIAGNOSIS — Z23 Encounter for immunization: Secondary | ICD-10-CM

## 2020-07-10 NOTE — Progress Notes (Signed)
   Covid-19 Vaccination Clinic  Name:  Nathan Schmitt    MRN: [id number] DOB: [birth year]  07/10/2020  [name] was observed post Covid-19 immunization for 15 minutes without incident. He was provided with Vaccine Information Sheet and instruction to access the V-Safe system.   [name] was instructed to call 911 with any severe reactions post vaccine: Marland Kitchen Difficulty breathing  . Swelling of face and throat  . A fast heartbeat  . A bad rash all over body  . Dizziness and weakness   Immunizations Administered    Name Date Dose VIS Date Route   Pfizer COVID-19 Vaccine 07/10/2020 10:24 AM 0.3 mL 05/13/2020 Intramuscular   Manufacturer: [place]   Lot: [id number]   [place]: [id number]

## 2020-07-13 MED FILL — PFIZER-BIONTECH COVID-19 VA: 30 | 1 days supply | Qty: 0 | Fill #0

## 2020-08-07 DIAGNOSIS — C61 Malignant neoplasm of prostate: Secondary | ICD-10-CM | POA: Diagnosis not present

## 2020-08-21 ENCOUNTER — Other Ambulatory Visit (HOSPITAL_BASED_OUTPATIENT_CLINIC_OR_DEPARTMENT_OTHER): Payer: Self-pay | Admitting: Urology

## 2020-08-21 DIAGNOSIS — C61 Malignant neoplasm of prostate: Secondary | ICD-10-CM | POA: Diagnosis not present

## 2020-08-21 DIAGNOSIS — N5201 Erectile dysfunction due to arterial insufficiency: Secondary | ICD-10-CM | POA: Diagnosis not present

## 2020-10-19 ENCOUNTER — Other Ambulatory Visit (HOSPITAL_BASED_OUTPATIENT_CLINIC_OR_DEPARTMENT_OTHER): Payer: Self-pay | Admitting: Internal Medicine

## 2020-10-19 MED FILL — LISINOPRIL 10 MG TABLET: 10 | 90 days supply | Qty: 90 | Fill #0

## 2020-10-19 MED FILL — ALLOPURINOL 300 MG TABLET: 300 | 90 days supply | Qty: 90 | Fill #0

## 2020-10-22 MED FILL — SILDENAFIL CITRATE 100 MG T: 100 | 30 days supply | Qty: 30 | Fill #0

## 2020-11-24 DIAGNOSIS — D509 Iron deficiency anemia, unspecified: Secondary | ICD-10-CM | POA: Diagnosis not present

## 2020-11-24 DIAGNOSIS — H01006 Unspecified blepharitis left eye, unspecified eyelid: Secondary | ICD-10-CM | POA: Diagnosis not present

## 2020-11-24 DIAGNOSIS — I10 Essential (primary) hypertension: Secondary | ICD-10-CM | POA: Diagnosis not present

## 2020-11-24 DIAGNOSIS — H01003 Unspecified blepharitis right eye, unspecified eyelid: Secondary | ICD-10-CM | POA: Diagnosis not present

## 2020-11-24 DIAGNOSIS — Z9079 Acquired absence of other genital organ(s): Secondary | ICD-10-CM | POA: Diagnosis not present

## 2020-11-24 DIAGNOSIS — M1A9XX0 Chronic gout, unspecified, without tophus (tophi): Secondary | ICD-10-CM | POA: Diagnosis not present

## 2020-11-24 DIAGNOSIS — Z8546 Personal history of malignant neoplasm of prostate: Secondary | ICD-10-CM | POA: Diagnosis not present

## 2021-01-19 MED FILL — Lisinopril Tab 10 MG: ORAL | 90 days supply | Qty: 90 | Fill #0 | Status: AC

## 2021-01-19 MED FILL — Allopurinol Tab 300 MG: ORAL | 90 days supply | Qty: 90 | Fill #0 | Status: AC

## 2021-01-20 ENCOUNTER — Other Ambulatory Visit (HOSPITAL_BASED_OUTPATIENT_CLINIC_OR_DEPARTMENT_OTHER): Payer: Self-pay

## 2021-03-03 DIAGNOSIS — C61 Malignant neoplasm of prostate: Secondary | ICD-10-CM | POA: Diagnosis not present

## 2021-03-10 DIAGNOSIS — C61 Malignant neoplasm of prostate: Secondary | ICD-10-CM | POA: Diagnosis not present

## 2021-04-12 ENCOUNTER — Other Ambulatory Visit (HOSPITAL_BASED_OUTPATIENT_CLINIC_OR_DEPARTMENT_OTHER): Payer: Self-pay

## 2021-04-12 MED ORDER — ALLOPURINOL 300 MG PO TABS
300.0000 mg | ORAL_TABLET | Freq: Every day | ORAL | 1 refills | Status: DC
  Filled 2021-04-12: qty 90, 90d supply, fill #0
  Filled 2021-07-21: qty 90, 90d supply, fill #1

## 2021-04-12 MED ORDER — LISINOPRIL 10 MG PO TABS
10.0000 mg | ORAL_TABLET | Freq: Every day | ORAL | 1 refills | Status: DC
  Filled 2021-04-12: qty 90, 90d supply, fill #0
  Filled 2021-07-21: qty 90, 90d supply, fill #1

## 2021-06-01 DIAGNOSIS — Z0000 Encounter for general adult medical examination without abnormal findings: Secondary | ICD-10-CM | POA: Diagnosis not present

## 2021-06-01 DIAGNOSIS — D509 Iron deficiency anemia, unspecified: Secondary | ICD-10-CM | POA: Diagnosis not present

## 2021-06-01 DIAGNOSIS — Z9079 Acquired absence of other genital organ(s): Secondary | ICD-10-CM | POA: Diagnosis not present

## 2021-06-01 DIAGNOSIS — M1A9XX0 Chronic gout, unspecified, without tophus (tophi): Secondary | ICD-10-CM | POA: Diagnosis not present

## 2021-06-01 DIAGNOSIS — I10 Essential (primary) hypertension: Secondary | ICD-10-CM | POA: Diagnosis not present

## 2021-06-01 DIAGNOSIS — Z8546 Personal history of malignant neoplasm of prostate: Secondary | ICD-10-CM | POA: Diagnosis not present

## 2021-07-21 ENCOUNTER — Other Ambulatory Visit (HOSPITAL_BASED_OUTPATIENT_CLINIC_OR_DEPARTMENT_OTHER): Payer: Self-pay

## 2021-07-21 MED FILL — Sildenafil Citrate Tab 100 MG: ORAL | 30 days supply | Qty: 30 | Fill #0 | Status: AC

## 2021-10-21 ENCOUNTER — Other Ambulatory Visit (HOSPITAL_BASED_OUTPATIENT_CLINIC_OR_DEPARTMENT_OTHER): Payer: Self-pay

## 2021-10-22 ENCOUNTER — Other Ambulatory Visit (HOSPITAL_BASED_OUTPATIENT_CLINIC_OR_DEPARTMENT_OTHER): Payer: Self-pay

## 2021-10-22 MED ORDER — ALLOPURINOL 300 MG PO TABS
300.0000 mg | ORAL_TABLET | Freq: Every day | ORAL | 1 refills | Status: DC
  Filled 2021-10-22: qty 90, 90d supply, fill #0
  Filled 2022-01-25: qty 90, 90d supply, fill #1

## 2021-10-22 MED ORDER — LISINOPRIL 10 MG PO TABS
10.0000 mg | ORAL_TABLET | Freq: Every day | ORAL | 1 refills | Status: DC
  Filled 2021-10-22: qty 90, 90d supply, fill #0
  Filled 2022-01-25: qty 90, 90d supply, fill #1

## 2022-01-26 ENCOUNTER — Other Ambulatory Visit (HOSPITAL_BASED_OUTPATIENT_CLINIC_OR_DEPARTMENT_OTHER): Payer: Self-pay

## 2022-03-21 ENCOUNTER — Other Ambulatory Visit: Payer: Self-pay | Admitting: Urology

## 2022-03-21 ENCOUNTER — Other Ambulatory Visit (HOSPITAL_COMMUNITY): Payer: Self-pay | Admitting: Urology

## 2022-03-21 DIAGNOSIS — C61 Malignant neoplasm of prostate: Secondary | ICD-10-CM

## 2022-03-31 ENCOUNTER — Other Ambulatory Visit (HOSPITAL_BASED_OUTPATIENT_CLINIC_OR_DEPARTMENT_OTHER): Payer: Self-pay

## 2022-03-31 MED ORDER — BOOSTRIX 5-2.5-18.5 LF-MCG/0.5 IM SUSY
PREFILLED_SYRINGE | INTRAMUSCULAR | 0 refills | Status: AC
  Filled 2022-03-31: qty 0.5, 1d supply, fill #0

## 2022-04-04 ENCOUNTER — Encounter (HOSPITAL_COMMUNITY)
Admission: RE | Admit: 2022-04-04 | Discharge: 2022-04-04 | Disposition: A | Discharge status: Home / Self Care | Payer: PPO | Source: Ambulatory Visit | Attending: Urology | Admitting: Urology

## 2022-04-04 DIAGNOSIS — C61 Malignant neoplasm of prostate: Secondary | ICD-10-CM | POA: Insufficient documentation

## 2022-04-04 MED ORDER — PIFLIFOLASTAT F 18 (PYLARIFY) INJECTION
9.0000 | Freq: Once | INTRAVENOUS | Status: AC
  Administered 2022-04-04: 9 via INTRAVENOUS

## 2022-04-07 ENCOUNTER — Ambulatory Visit (HOSPITAL_COMMUNITY)
Admission: RE | Admit: 2022-04-07 | Discharge: 2022-04-07 | Disposition: A | Discharge status: Home / Self Care | Payer: PPO | Source: Ambulatory Visit | Attending: Urology | Admitting: Urology

## 2022-04-07 ENCOUNTER — Encounter (HOSPITAL_COMMUNITY)
Admission: RE | Admit: 2022-04-07 | Discharge: 2022-04-07 | Disposition: A | Discharge status: Home / Self Care | Payer: PPO | Source: Ambulatory Visit | Attending: Urology | Admitting: Urology

## 2022-04-07 DIAGNOSIS — C61 Malignant neoplasm of prostate: Secondary | ICD-10-CM | POA: Insufficient documentation

## 2022-04-07 MED ORDER — TECHNETIUM TC 99M MEDRONATE IV KIT
20.0000 | PACK | Freq: Once | INTRAVENOUS | Status: AC | PRN
  Administered 2022-04-07: 20.8 via INTRAVENOUS

## 2022-04-08 ENCOUNTER — Other Ambulatory Visit (HOSPITAL_BASED_OUTPATIENT_CLINIC_OR_DEPARTMENT_OTHER): Payer: Self-pay

## 2022-04-19 ENCOUNTER — Other Ambulatory Visit (HOSPITAL_BASED_OUTPATIENT_CLINIC_OR_DEPARTMENT_OTHER): Payer: Self-pay

## 2022-04-20 ENCOUNTER — Other Ambulatory Visit (HOSPITAL_BASED_OUTPATIENT_CLINIC_OR_DEPARTMENT_OTHER): Payer: Self-pay

## 2022-04-20 MED ORDER — ALLOPURINOL 300 MG PO TABS
300.0000 mg | ORAL_TABLET | Freq: Every day | ORAL | 1 refills | Status: DC
  Filled 2022-04-20: qty 90, 90d supply, fill #0
  Filled 2022-07-26: qty 90, 90d supply, fill #1

## 2022-04-20 MED ORDER — LISINOPRIL 10 MG PO TABS
10.0000 mg | ORAL_TABLET | Freq: Every day | ORAL | 1 refills | Status: DC
  Filled 2022-04-20: qty 90, 90d supply, fill #0
  Filled 2022-07-26: qty 90, 90d supply, fill #1

## 2022-04-21 ENCOUNTER — Other Ambulatory Visit (HOSPITAL_BASED_OUTPATIENT_CLINIC_OR_DEPARTMENT_OTHER): Payer: Self-pay

## 2022-04-27 ENCOUNTER — Other Ambulatory Visit (HOSPITAL_BASED_OUTPATIENT_CLINIC_OR_DEPARTMENT_OTHER): Payer: Self-pay

## 2022-07-27 ENCOUNTER — Other Ambulatory Visit (HOSPITAL_BASED_OUTPATIENT_CLINIC_OR_DEPARTMENT_OTHER): Payer: Self-pay

## 2022-09-02 ENCOUNTER — Encounter: Payer: Self-pay | Admitting: Internal Medicine

## 2022-10-24 ENCOUNTER — Other Ambulatory Visit (HOSPITAL_BASED_OUTPATIENT_CLINIC_OR_DEPARTMENT_OTHER): Payer: Self-pay

## 2022-10-24 MED ORDER — ALLOPURINOL 300 MG PO TABS
300.0000 mg | ORAL_TABLET | Freq: Every day | ORAL | 1 refills | Status: DC
  Filled 2022-10-24: qty 90, 90d supply, fill #0
  Filled 2023-02-02: qty 90, 90d supply, fill #1

## 2022-10-24 MED ORDER — LISINOPRIL 10 MG PO TABS
10.0000 mg | ORAL_TABLET | Freq: Every day | ORAL | 1 refills | Status: DC
  Filled 2022-10-24: qty 90, 90d supply, fill #0
  Filled 2023-02-02: qty 90, 90d supply, fill #1

## 2023-04-12 ENCOUNTER — Other Ambulatory Visit (HOSPITAL_COMMUNITY): Payer: Self-pay | Admitting: Urology

## 2023-04-12 DIAGNOSIS — C61 Malignant neoplasm of prostate: Secondary | ICD-10-CM

## 2023-04-13 ENCOUNTER — Encounter (HOSPITAL_COMMUNITY)
Admission: RE | Admit: 2023-04-13 | Discharge: 2023-04-13 | Disposition: A | Discharge status: Home / Self Care | Payer: PPO | Source: Ambulatory Visit | Attending: Urology | Admitting: Urology

## 2023-04-13 DIAGNOSIS — C61 Malignant neoplasm of prostate: Secondary | ICD-10-CM | POA: Diagnosis present
